# Patient Record
Sex: Male | Born: 1975 | Race: White | Hispanic: No | Marital: Single | Smoking: Current every day smoker
Health system: Southern US, Community
[De-identification: ages and names within clinical notes are randomized; demographics above are authoritative.]

## PROBLEM LIST (undated history)

## (undated) DIAGNOSIS — Z87442 Personal history of urinary calculi: Secondary | ICD-10-CM

## (undated) DIAGNOSIS — K219 Gastro-esophageal reflux disease without esophagitis: Secondary | ICD-10-CM

## (undated) DIAGNOSIS — N200 Calculus of kidney: Secondary | ICD-10-CM

## (undated) DIAGNOSIS — T8859XA Other complications of anesthesia, initial encounter: Secondary | ICD-10-CM

## (undated) HISTORY — PX: COLONOSCOPY: SHX174

## (undated) HISTORY — PX: EYE SURGERY: SHX253

## (undated) HISTORY — PX: VASECTOMY: SHX75

## (undated) HISTORY — PX: UPPER GASTROINTESTINAL ENDOSCOPY: SHX188

## (undated) HISTORY — PX: OTHER SURGICAL HISTORY: SHX169

## (undated) HISTORY — PX: HAND SURGERY: SHX662

---

## 1999-03-15 ENCOUNTER — Emergency Department (HOSPITAL_COMMUNITY): Admission: EM | Admit: 1999-03-15 | Discharge: 1999-03-15 | Payer: Self-pay

## 2004-10-23 ENCOUNTER — Emergency Department (HOSPITAL_COMMUNITY): Admission: EM | Admit: 2004-10-23 | Discharge: 2004-10-23 | Payer: Self-pay | Admitting: Emergency Medicine

## 2004-10-29 ENCOUNTER — Emergency Department (HOSPITAL_COMMUNITY): Admission: EM | Admit: 2004-10-29 | Discharge: 2004-10-30 | Payer: Self-pay | Admitting: Emergency Medicine

## 2005-07-13 ENCOUNTER — Emergency Department (HOSPITAL_COMMUNITY): Admission: EM | Admit: 2005-07-13 | Discharge: 2005-07-13 | Payer: Self-pay | Admitting: Emergency Medicine

## 2005-07-20 ENCOUNTER — Ambulatory Visit (HOSPITAL_COMMUNITY): Admission: RE | Admit: 2005-07-20 | Discharge: 2005-07-20 | Payer: Self-pay | Admitting: *Deleted

## 2005-07-20 ENCOUNTER — Ambulatory Visit (HOSPITAL_BASED_OUTPATIENT_CLINIC_OR_DEPARTMENT_OTHER): Admission: RE | Admit: 2005-07-20 | Discharge: 2005-07-20 | Payer: Self-pay | Admitting: *Deleted

## 2010-01-13 ENCOUNTER — Emergency Department: Payer: Self-pay | Admitting: Emergency Medicine

## 2010-08-07 ENCOUNTER — Emergency Department: Payer: Self-pay | Admitting: Emergency Medicine

## 2011-10-04 ENCOUNTER — Emergency Department: Payer: Self-pay | Admitting: Unknown Physician Specialty

## 2014-10-02 ENCOUNTER — Emergency Department: Payer: Self-pay | Admitting: Emergency Medicine

## 2014-10-02 LAB — URINALYSIS, COMPLETE
Bacteria: NONE SEEN
Bilirubin,UR: NEGATIVE
Glucose,UR: NEGATIVE mg/dL (ref 0–75)
Ketone: NEGATIVE
Leukocyte Esterase: NEGATIVE
Nitrite: NEGATIVE
Ph: 5 (ref 4.5–8.0)
Protein: NEGATIVE
RBC,UR: 152 /HPF (ref 0–5)
Specific Gravity: 1.021 (ref 1.003–1.030)
Squamous Epithelial: <1
WBC UR: 4 /HPF (ref 0–5)

## 2016-04-30 ENCOUNTER — Encounter: Payer: Self-pay | Admitting: Emergency Medicine

## 2016-04-30 ENCOUNTER — Emergency Department
Admission: EM | Admit: 2016-04-30 | Discharge: 2016-04-30 | Disposition: A | Discharge status: Home / Self Care | Payer: BLUE CROSS/BLUE SHIELD | Attending: Emergency Medicine | Admitting: Emergency Medicine

## 2016-04-30 ENCOUNTER — Emergency Department: Payer: BLUE CROSS/BLUE SHIELD

## 2016-04-30 DIAGNOSIS — N201 Calculus of ureter: Secondary | ICD-10-CM | POA: Diagnosis not present

## 2016-04-30 DIAGNOSIS — N200 Calculus of kidney: Secondary | ICD-10-CM | POA: Diagnosis present

## 2016-04-30 DIAGNOSIS — F17200 Nicotine dependence, unspecified, uncomplicated: Secondary | ICD-10-CM | POA: Insufficient documentation

## 2016-04-30 HISTORY — DX: Calculus of kidney: N20.0

## 2016-04-30 HISTORY — DX: Gastro-esophageal reflux disease without esophagitis: K21.9

## 2016-04-30 LAB — CBC WITH DIFFERENTIAL/PLATELET
Basophils Absolute: 0.1 10*3/uL (ref 0–0.1)
Basophils Relative: 1 %
Eosinophils Absolute: 0.4 10*3/uL (ref 0–0.7)
Eosinophils Relative: 5 %
HCT: 47 % (ref 40.0–52.0)
Hemoglobin: 15.9 g/dL (ref 13.0–18.0)
Lymphocytes Relative: 31 %
Lymphs Abs: 2.8 10*3/uL (ref 1.0–3.6)
MCH: 31.8 pg (ref 26.0–34.0)
MCHC: 33.9 g/dL (ref 32.0–36.0)
MCV: 93.8 fL (ref 80.0–100.0)
Monocytes Absolute: 0.8 10*3/uL (ref 0.2–1.0)
Monocytes Relative: 9 %
Neutro Abs: 5 10*3/uL (ref 1.4–6.5)
Neutrophils Relative %: 54 %
Platelets: 178 10*3/uL (ref 150–440)
RBC: 5.01 MIL/uL (ref 4.40–5.90)
RDW: 13 % (ref 11.5–14.5)
WBC: 9.1 10*3/uL (ref 3.8–10.6)

## 2016-04-30 LAB — URINALYSIS COMPLETE WITH MICROSCOPIC (ARMC ONLY)
Bilirubin Urine: NEGATIVE
Glucose, UA: NEGATIVE mg/dL
Hgb urine dipstick: NEGATIVE
Ketones, ur: NEGATIVE mg/dL
Leukocytes, UA: NEGATIVE
Nitrite: NEGATIVE
Protein, ur: 30 mg/dL — AB
Specific Gravity, Urine: 1.03 (ref 1.005–1.030)
pH: 6 (ref 5.0–8.0)

## 2016-04-30 LAB — COMPREHENSIVE METABOLIC PANEL
ALT: 30 U/L (ref 17–63)
AST: 24 U/L (ref 15–41)
Albumin: 4.1 g/dL (ref 3.5–5.0)
Alkaline Phosphatase: 84 U/L (ref 38–126)
Anion gap: 5 (ref 5–15)
BUN: 17 mg/dL (ref 6–20)
CO2: 29 mmol/L (ref 22–32)
Calcium: 9.5 mg/dL (ref 8.9–10.3)
Chloride: 107 mmol/L (ref 101–111)
Creatinine, Ser: 1.19 mg/dL (ref 0.61–1.24)
GFR calc Af Amer: >60 mL/min (ref >60)
GFR calc non Af Amer: >60 mL/min (ref >60)
Glucose, Bld: 108 mg/dL — ABNORMAL HIGH (ref 65–99)
Potassium: 4.6 mmol/L (ref 3.5–5.1)
Sodium: 141 mmol/L (ref 135–145)
Total Bilirubin: 0.5 mg/dL (ref 0.3–1.2)
Total Protein: 6.7 g/dL (ref 6.5–8.1)

## 2016-04-30 MED ORDER — NAPROXEN 500 MG PO TABS: 500.0000 mg | ORAL_TABLET | Freq: Two times a day (BID) | ORAL | Status: DC

## 2016-04-30 MED ORDER — KETOROLAC TROMETHAMINE 30 MG/ML IJ SOLN
30.0000 mg | Freq: Once | INTRAMUSCULAR | Status: AC
  Administered 2016-04-30: 30 mg via INTRAMUSCULAR
  Filled 2016-04-30: qty 1

## 2016-04-30 MED ORDER — TAMSULOSIN HCL 0.4 MG PO CAPS: 0.4000 mg | ORAL_CAPSULE | Freq: Every day | ORAL | Status: DC

## 2016-04-30 MED ORDER — OXYCODONE-ACETAMINOPHEN 5-325 MG PO TABS
1.0000 | ORAL_TABLET | ORAL | Status: DC | PRN
  Administered 2016-04-30: 1 via ORAL
  Filled 2016-04-30: qty 1

## 2016-04-30 MED ORDER — OXYCODONE-ACETAMINOPHEN 5-325 MG PO TABS: 1.0000 | ORAL_TABLET | Freq: Four times a day (QID) | ORAL | Status: DC | PRN

## 2016-04-30 MED ORDER — ONDANSETRON HCL 4 MG PO TABS: 4.0000 mg | ORAL_TABLET | Freq: Every day | ORAL | Status: DC | PRN

## 2016-04-30 NOTE — ED Notes (Addendum)
Patient states that he has been having right kidney pain for about a week and a half, denies urinary symptoms, patient states that he has not injured that area that he knows of. Pain got acutely worse last PM which is why patient came to the ED today.   Patient states that he has had kidney stones in the past and this is similar to the pain he had then.

## 2016-04-30 NOTE — ED Notes (Signed)
Patient to ER for c/o right sided flank pain. States he has h/o kidney stones, feels the same as his typical pain related to kidney stones.

## 2016-04-30 NOTE — ED Provider Notes (Signed)
[facility] Emergency Department Provider Note  ____________________________________________    I have reviewed the triage vital signs and the nursing notes.   HISTORY  Chief Complaint Nephrolithiasis    HPI Maurice Ramos is a 40 y.o. male who presents with complaints of right flank pain. He has a history of kidney stones reports this feels exactly like a kidney stone. He has had pain for approximately 1-1/2 weeks. He has intermittent pain which is sharp, currently it is a dull ache. He received pain medication in triage which helped significantly. No fevers or chills. No nausea or vomiting. No hematuria. No dysuria.     Past Medical History  Diagnosis Date  . Kidney stones   . GERD (gastroesophageal reflux disease)     There are no active problems to display for this patient.   History reviewed. No pertinent past surgical history.  Current Outpatient Rx  Name  Route  Sig  Dispense  Refill  . naproxen (NAPROSYN) 500 MG tablet   Oral   Take 1 tablet (500 mg total) by mouth 2 (two) times daily with a meal.   20 tablet   2   . ondansetron (ZOFRAN) 4 MG tablet   Oral   Take 1 tablet (4 mg total) by mouth daily as needed for nausea or vomiting.   20 tablet   1   . oxyCODONE-acetaminophen (ROXICET) 5-325 MG tablet   Oral   Take 1 tablet by mouth every 6 (six) hours as needed.   20 tablet   0   . tamsulosin (FLOMAX) 0.4 MG CAPS capsule   Oral   Take 1 capsule (0.4 mg total) by mouth daily.   7 capsule   0     Allergies Review of patient's allergies indicates no known allergies.  No family history on file.  Social History Social History  Substance Use Topics  . Smoking status: Current Every Day Smoker  . Smokeless tobacco: None  . Alcohol Use: No    Review of Systems  Constitutional: Negative for fever. Eyes: Negative for redness  Cardiovascular: Negative for chest pain Respiratory: Negative for Cough Gastrointestinal: As  above for flank pain Genitourinary: Negative for dysuria. Musculoskeletal: Negative for back pain. Skin: Negative for rash. Neurological: Negative for headache Psychiatric: no anxiety    ____________________________________________   PHYSICAL EXAM:  VITAL SIGNS: ED Triage Vitals  Enc Vitals Group     BP 04/30/16 1314 133/79 mmHg     Pulse Rate 04/30/16 1314 78     Resp 04/30/16 1314 18     Temp 04/30/16 1314 97.7 F (36.5 C)     Temp Source 04/30/16 1314 Oral     SpO2 04/30/16 1314 99 %     Weight 04/30/16 1314 220 lb (99.791 kg)     Height 04/30/16 1314  (1.753 m)     Head Cir --      Peak Flow --      Pain Score 04/30/16 1314 6     Pain Loc --      Pain Edu? --      Excl. in GC? --      Constitutional: Alert and oriented. Well appearing and in no distress.  Eyes: Conjunctivae are normal. No erythema or injection ENT   Head: Normocephalic and atraumatic.   Mouth/Throat: Mucous membranes are moist. Cardiovascular: Normal rate, regular rhythm.  Respiratory: Normal respiratory effort without tachypnea nor retractions.  Gastrointestinal: Soft and non-tender in all quadrants. No distention. There  is no CVA tenderness. Genitourinary: deferred Musculoskeletal: Nontender with normal range of motion in all extremities. No lower extremity tenderness nor edema. Neurologic:  Normal speech and language. No gross focal neurologic deficits are appreciated. Skin:  Skin is warm, dry and intact. No rash noted. Psychiatric: Mood and affect are normal. Patient exhibits appropriate insight and judgment.  ____________________________________________    LABS (pertinent positives/negatives)  Labs Reviewed  URINALYSIS COMPLETEWITH MICROSCOPIC ([facility] ONLY) - Abnormal; Notable for the following:    Color, Urine YELLOW (*)    APPearance CLEAR (*)    Protein, ur 30 (*)    Bacteria, UA RARE (*)    Squamous Epithelial / LPF 0-5 (*)    All other components within normal  limits  COMPREHENSIVE METABOLIC PANEL - Abnormal; Notable for the following:    Glucose, Bld 108 (*)    All other components within normal limits  CBC WITH DIFFERENTIAL/PLATELET    ____________________________________________   EKG  None  ____________________________________________    RADIOLOGY  KUB shows questionable proximal right ureteral stone  ____________________________________________   PROCEDURES  Procedure(s) performed: none  Critical Care performed: none  ____________________________________________   INITIAL IMPRESSION / ASSESSMENT AND PLAN / ED COURSE  Pertinent labs & imaging results that were available during my care of the patient were reviewed by me and considered in my medical decision making (see chart for details).  Patient treated with Percocet in triage and Toradol IM. He has had complete resolution of pain. No evidence of infection on urinalysis, lab work is normal. Afebrile, no tachycardia. Comfortable and nontoxic. KUB shows likely proximal ureteral stone. Offered CT renal stone study the patient reports he is feeling well and would prefer to go home with pain medication and urology follow-up. He knows to return if fever or chills worsening pain ____________________________________________   FINAL CLINICAL IMPRESSION(S) / ED DIAGNOSES  Final diagnoses:  Ureterolithiasis          Jene Everyobert Dealva Lafoy, MD 04/30/16 [phone]

## 2016-04-30 NOTE — Discharge Instructions (Signed)
Kidney Stones °Kidney stones (urolithiasis) are deposits that form inside your kidneys. The intense pain is caused by the stone moving through the urinary tract. When the stone moves, the ureter goes into spasm around the stone. The stone is usually passed in the urine.  °CAUSES  °· A disorder that makes certain neck glands produce too much parathyroid hormone (primary hyperparathyroidism). °· A buildup of uric acid crystals, similar to gout in your joints. °· Narrowing (stricture) of the ureter. °· A kidney obstruction present at birth (congenital obstruction). °· Previous surgery on the kidney or ureters. °· Numerous kidney infections. °SYMPTOMS  °· Feeling sick to your stomach (nauseous). °· Throwing up (vomiting). °· Blood in the urine (hematuria). °· Pain that usually spreads (radiates) to the groin. °· Frequency or urgency of urination. °DIAGNOSIS  °· Taking a history and physical exam. °· Blood or urine tests. °· CT scan. °· Occasionally, an examination of the inside of the urinary bladder (cystoscopy) is performed. °TREATMENT  °· Observation. °· Increasing your fluid intake. °· Extracorporeal shock wave lithotripsy--This is a noninvasive procedure that uses shock waves to break up kidney stones. °· Surgery may be needed if you have severe pain or persistent obstruction. There are various surgical procedures. Most of the procedures are performed with the use of small instruments. Only small incisions are needed to accommodate these instruments, so recovery time is minimized. °The size, location, and chemical composition are all important variables that will determine the proper choice of action for you. Talk to your health care provider to better understand your situation so that you will minimize the risk of injury to yourself and your kidney.  °HOME CARE INSTRUCTIONS  °· Drink enough water and fluids to keep your urine clear or pale yellow. This will help you to pass the stone or stone fragments. °· Strain  all urine through the provided strainer. Keep all particulate matter and stones for your health care provider to see. The stone causing the pain may be as small as a grain of salt. It is very important to use the strainer each and every time you pass your urine. The collection of your stone will allow your health care provider to analyze it and verify that a stone has actually passed. The stone analysis will often identify what you can do to reduce the incidence of recurrences. °· Only take over-the-counter or prescription medicines for pain, discomfort, or fever as directed by your health care provider. °· Keep all follow-up visits as told by your health care provider. This is important. °· Get follow-up X-rays if required. The absence of pain does not always mean that the stone has passed. It may have only stopped moving. If the urine remains completely obstructed, it can cause loss of kidney function or even complete destruction of the kidney. It is your responsibility to make sure X-rays and follow-ups are completed. Ultrasounds of the kidney can show blockages and the status of the kidney. Ultrasounds are not associated with any radiation and can be performed easily in a matter of minutes. °· Make changes to your daily diet as told by your health care provider. You may be told to: °¨ Limit the amount of salt that you eat. °¨ Eat 5 or more servings of fruits and vegetables each day. °¨ Limit the amount of meat, poultry, fish, and eggs that you eat. °· Collect a 24-hour urine sample as told by your health care provider. You may need to collect another urine sample every 6-12   months. °SEEK MEDICAL CARE IF: °· You experience pain that is progressive and unresponsive to any pain medicine you have been prescribed. °SEEK IMMEDIATE MEDICAL CARE IF:  °· Pain cannot be controlled with the prescribed medicine. °· You have a fever or shaking chills. °· The severity or intensity of pain increases over 18 hours and is not  relieved by pain medicine. °· You develop a new onset of abdominal pain. °· You feel faint or pass out. °· You are unable to urinate. °  °This information is not intended to replace advice given to you by your health care provider. Make sure you discuss any questions you have with your health care provider. °  °Document Released: 12/05/2005 Document Revised: 08/26/2015 Document Reviewed: 05/08/2013 °Elsevier Interactive Patient Education ©2016 [employer] ° °

## 2016-04-30 NOTE — ED Notes (Addendum)
Pt observed laughing in lobby with family/friends and pt ambulatory to stat desk w/ no distress; pt explained when blood drawn he still had to wait to be seen by dr for results and there is still a wait time and we were doing the best we can to see everyone. Pt seemed understanding of answer and sent back out to lobby

## 2016-10-18 ENCOUNTER — Emergency Department
Admission: EM | Admit: 2016-10-18 | Discharge: 2016-10-18 | Disposition: A | Discharge status: Home / Self Care | Payer: BLUE CROSS/BLUE SHIELD | Attending: Student in an Organized Health Care Education/Training Program | Admitting: Student in an Organized Health Care Education/Training Program

## 2016-10-18 ENCOUNTER — Emergency Department: Payer: BLUE CROSS/BLUE SHIELD

## 2016-10-18 ENCOUNTER — Encounter: Payer: Self-pay | Admitting: Emergency Medicine

## 2016-10-18 DIAGNOSIS — N201 Calculus of ureter: Secondary | ICD-10-CM | POA: Insufficient documentation

## 2016-10-18 DIAGNOSIS — F17200 Nicotine dependence, unspecified, uncomplicated: Secondary | ICD-10-CM | POA: Insufficient documentation

## 2016-10-18 DIAGNOSIS — R109 Unspecified abdominal pain: Secondary | ICD-10-CM

## 2016-10-18 DIAGNOSIS — Z791 Long term (current) use of non-steroidal anti-inflammatories (NSAID): Secondary | ICD-10-CM | POA: Insufficient documentation

## 2016-10-18 LAB — URINALYSIS COMPLETE WITH MICROSCOPIC (ARMC ONLY)
Bacteria, UA: NONE SEEN
Bilirubin Urine: NEGATIVE
Glucose, UA: NEGATIVE mg/dL
Ketones, ur: NEGATIVE mg/dL
Leukocytes, UA: NEGATIVE
Nitrite: NEGATIVE
Protein, ur: 30 mg/dL — AB
Specific Gravity, Urine: 1.021 (ref 1.005–1.030)
Squamous Epithelial / HPF: NONE SEEN
pH: 7 (ref 5.0–8.0)

## 2016-10-18 LAB — COMPREHENSIVE METABOLIC PANEL WITH GFR
ALT: 22 U/L (ref 17–63)
AST: 27 U/L (ref 15–41)
Albumin: 4.2 g/dL (ref 3.5–5.0)
Alkaline Phosphatase: 87 U/L (ref 38–126)
Anion gap: 5 (ref 5–15)
BUN: 16 mg/dL (ref 6–20)
CO2: 30 mmol/L (ref 22–32)
Calcium: 9.4 mg/dL (ref 8.9–10.3)
Chloride: 106 mmol/L (ref 101–111)
Creatinine, Ser: 1.49 mg/dL — ABNORMAL HIGH (ref 0.61–1.24)
GFR calc Af Amer: >60 mL/min
GFR calc non Af Amer: 58 mL/min — ABNORMAL LOW
Glucose, Bld: 106 mg/dL — ABNORMAL HIGH (ref 65–99)
Potassium: 4.8 mmol/L (ref 3.5–5.1)
Sodium: 141 mmol/L (ref 135–145)
Total Bilirubin: 0.3 mg/dL (ref 0.3–1.2)
Total Protein: 7.1 g/dL (ref 6.5–8.1)

## 2016-10-18 LAB — CBC WITH DIFFERENTIAL/PLATELET
Basophils Absolute: 0.2 10*3/uL — ABNORMAL HIGH (ref 0–0.1)
Basophils Relative: 1 %
Eosinophils Absolute: 0.5 10*3/uL (ref 0–0.7)
Eosinophils Relative: 4 %
HCT: 46.6 % (ref 40.0–52.0)
Hemoglobin: 15.6 g/dL (ref 13.0–18.0)
Lymphocytes Relative: 39 %
Lymphs Abs: 4.6 10*3/uL — ABNORMAL HIGH (ref 1.0–3.6)
MCH: 31.9 pg (ref 26.0–34.0)
MCHC: 33.5 g/dL (ref 32.0–36.0)
MCV: 95.2 fL (ref 80.0–100.0)
Monocytes Absolute: 0.9 10*3/uL (ref 0.2–1.0)
Monocytes Relative: 8 %
Neutro Abs: 5.7 10*3/uL (ref 1.4–6.5)
Neutrophils Relative %: 48 %
Platelets: 192 10*3/uL (ref 150–440)
RBC: 4.89 MIL/uL (ref 4.40–5.90)
RDW: 13.1 % (ref 11.5–14.5)
WBC: 11.8 10*3/uL — ABNORMAL HIGH (ref 3.8–10.6)

## 2016-10-18 MED ORDER — MORPHINE SULFATE (PF) 2 MG/ML IV SOLN
4.0000 mg | INTRAVENOUS | Status: DC | PRN
  Administered 2016-10-18: 4 mg via INTRAVENOUS
  Filled 2016-10-18: qty 2

## 2016-10-18 MED ORDER — TAMSULOSIN HCL 0.4 MG PO CAPS
0.4000 mg | ORAL_CAPSULE | Freq: Once | ORAL | Status: AC
  Administered 2016-10-18: 0.4 mg via ORAL
  Filled 2016-10-18: qty 1

## 2016-10-18 MED ORDER — PROMETHAZINE HCL 12.5 MG PO TABS: 12.5000 mg | ORAL_TABLET | Freq: Four times a day (QID) | ORAL | 0 refills | Status: DC | PRN

## 2016-10-18 MED ORDER — HYDROMORPHONE HCL 1 MG/ML IJ SOLN
1.0000 mg | INTRAMUSCULAR | Status: DC | PRN
  Administered 2016-10-18: 1 mg via INTRAVENOUS
  Filled 2016-10-18: qty 1

## 2016-10-18 MED ORDER — SODIUM CHLORIDE 0.9 % IV BOLUS (SEPSIS)
1000.0000 mL | Freq: Once | INTRAVENOUS | Status: AC
  Administered 2016-10-18: 1000 mL via INTRAVENOUS

## 2016-10-18 MED ORDER — OXYCODONE-ACETAMINOPHEN 5-325 MG PO TABS: 1.0000 | ORAL_TABLET | Freq: Four times a day (QID) | ORAL | 0 refills | Status: AC | PRN

## 2016-10-18 MED ORDER — KETOROLAC TROMETHAMINE 30 MG/ML IJ SOLN
30.0000 mg | Freq: Once | INTRAMUSCULAR | Status: AC
  Administered 2016-10-18: 30 mg via INTRAVENOUS
  Filled 2016-10-18: qty 1

## 2016-10-18 MED ORDER — HYDROCODONE-ACETAMINOPHEN 5-325 MG PO TABS
1.0000 | ORAL_TABLET | Freq: Once | ORAL | Status: AC
  Administered 2016-10-18: 1 via ORAL
  Filled 2016-10-18: qty 1

## 2016-10-18 MED ORDER — TAMSULOSIN HCL 0.4 MG PO CAPS: 0.4000 mg | ORAL_CAPSULE | Freq: Every day | ORAL | 0 refills | Status: DC

## 2016-10-18 MED ORDER — PROMETHAZINE HCL 25 MG/ML IJ SOLN
12.5000 mg | Freq: Once | INTRAMUSCULAR | Status: AC
  Administered 2016-10-18: 12.5 mg via INTRAVENOUS
  Filled 2016-10-18: qty 1

## 2016-10-18 NOTE — Discharge Instructions (Signed)
Return for fevers, intractable, pain, inability to tolerate food or hydration by mouth.

## 2016-10-18 NOTE — ED Triage Notes (Signed)
Pt ambulatory to room 7, appears uncomfortable; st awoken PTA with right flank pain radiating into lower abd accomp by N/V; st hx kidney stones

## 2016-10-18 NOTE — ED Provider Notes (Signed)
[facility] Emergency Department Provider Note    First MD Initiated Contact with Patient 10/18/16 0020     (approximate)  I have reviewed the triage vital signs and the nursing notes.   HISTORY  Chief Complaint No chief complaint on file.    HPI Maurice Ramos is a 40 y.o. male with a history of reflux and kidney stones presents with acute onset 10/10 right flank pain with radiation down into his groin and right testicle associated with nausea and vomiting. Patient states the pain is waxing and waning and feels identical to his previous kidney stones. States that he has not had one this severe in several years however. Denies any fevers or dysuria. Has not ever had have any stones surgically removed. Does have a positive family history of kidney stones. No history of hypertension.   Past Medical History:  Diagnosis Date  . GERD (gastroesophageal reflux disease)   . Kidney stones     There are no active problems to display for this patient.   No past surgical history on file.  Prior to Admission medications   Medication Sig Start Date End Date Taking? Authorizing Provider  naproxen (NAPROSYN) 500 MG tablet Take 1 tablet (500 mg total) by mouth 2 (two) times daily with a meal. 04/30/16   Jene Everyobert Kinner, MD  ondansetron (ZOFRAN) 4 MG tablet Take 1 tablet (4 mg total) by mouth daily as needed for nausea or vomiting. 04/30/16   Jene Everyobert Kinner, MD  oxyCODONE-acetaminophen (ROXICET) 5-325 MG tablet Take 1 tablet by mouth every 6 (six) hours as needed. 04/30/16 04/30/17  Jene Everyobert Kinner, MD  tamsulosin (FLOMAX) 0.4 MG CAPS capsule Take 1 capsule (0.4 mg total) by mouth daily. 04/30/16   Jene Everyobert Kinner, MD    Allergies Review of patient's allergies indicates no known allergies.  No family history on file.  Social History Social History  Substance Use Topics  . Smoking status: Current Every Day Smoker  . Smokeless tobacco: Not on file  . Alcohol use No     Review of Systems Patient denies headaches, rhinorrhea, blurry vision, numbness, shortness of breath, chest pain, edema, cough, abdominal pain, nausea, vomiting, diarrhea, dysuria, fevers, rashes or hallucinations unless otherwise stated above in HPI. ____________________________________________   PHYSICAL EXAM:  VITAL SIGNS: Vitals:   10/18/16 0024  BP: (!) 125/100  Pulse: 88  Resp: 20  Temp: 98 F (36.7 C)    Constitutional: Alert and oriented. Ill appearing and uncomfortable Eyes: Conjunctivae are normal. PERRL. EOMI. Head: Atraumatic. Nose: No congestion/rhinnorhea. Mouth/Throat: Mucous membranes are moist.  Oropharynx non-erythematous. Neck: No stridor. Painless ROM. No cervical spine tenderness to palpation Hematological/Lymphatic/Immunilogical: No cervical lymphadenopathy. Cardiovascular: Normal rate, regular rhythm. Grossly normal heart sounds.  Good peripheral circulation. Respiratory: Normal respiratory effort.  No retractions. Lungs CTAB. Gastrointestinal: Soft and nontender. No distention. No abdominal bruits. Right CVA tenderness. Genitourinary:  Musculoskeletal: No lower extremity tenderness nor edema.  No joint effusions. Neurologic:  Normal speech and language. No gross focal neurologic deficits are appreciated. No gait instability. Skin:  Skin is warm, dry and intact. No rash noted. Psychiatric: Mood and affect are normal. Speech and behavior are normal.  ____________________________________________   LABS (all labs ordered are listed, but only abnormal results are displayed)  No results found for this or any previous visit (from the past 24 hour(s)). ____________________________________________   ____________________________________________  RADIOLOGY  I personally reviewed all radiographic images ordered to evaluate for the above acute complaints and reviewed radiology reports  and findings.  These findings were personally discussed with the  patient.  Please see medical record for radiology report.  ____________________________________________   PROCEDURES  Procedure(s) performed: none    Critical Care performed: no ____________________________________________   INITIAL IMPRESSION / ASSESSMENT AND PLAN / ED COURSE  Pertinent labs & imaging results that were available during my care of the patient were reviewed by me and considered in my medical decision making (see chart for details).  DDX: Stone, Pilo, muscular skeletal pain, AAA, colitis, diverticulitis  Maurice FetchRodney Dolney is a 40 y.o. who presents to the ED with with Hx of kidney stones p/w right flank pain. No fevers, no systemic symptoms. - urinary symptoms. Denies trauma or injury. Afebrile in ED. Exam as above. Flank TTP, otherwise abdominal exam is benign. No peritoneal signs. Possible kidney stone, cystitis, or pyelonephritis.  Clinical picture is not consistent with appendicitis, diverticulitis, pancreatitis, cholecystitis, bowel perforation, aortic dissection, splenic injury or acute abdominal process at this time.    Clinical Course  Comment By Time  Patient reassessed. Willy EddyPatrick Teressa Mcglocklin, MD 10/31 0222  Pain has improved. Discussed findings on CT imaging shows evidence of small 3 mm right-sided stone which should explain the patient's presentation. Willy EddyPatrick Masen Salvas, MD 10/31 [phone]  Urinalysis with no evidence of bacteria or infection. We'll try to transition patient on oral medications. Willy EddyPatrick Marsha Hillman, MD 10/31 0230  Patient reassessed. Pain now down to a 3 out of 10. Patient is tolerating oral hydration. He is stable vital signs. Based on his high probability of spontaneously passing the stone without any signs of infection and improvement in symptoms or do feel the patient is appropriate for outpatient follow-up with urology.  Patient was able to tolerate PO and was able to ambulate with a steady gait.  Have discussed with the patient and available family all  diagnostics and treatments performed thus far and all questions were answered to the best of my ability. The patient demonstrates understanding and agreement with plan.  Willy EddyPatrick Hulet Ehrmann, MD 10/31 [phone]     ____________________________________________   FINAL CLINICAL IMPRESSION(S) / ED DIAGNOSES  Final diagnoses:  Ureterolithiasis  Right flank pain      NEW MEDICATIONS STARTED DURING THIS VISIT:  New Prescriptions   No medications on file     Note:  This document was prepared using Dragon voice recognition software and may include unintentional dictation errors.    Willy EddyPatrick Georgena Weisheit, MD 10/18/16 [phone]

## 2018-04-17 ENCOUNTER — Encounter: Payer: Self-pay | Admitting: Emergency Medicine

## 2018-04-17 ENCOUNTER — Emergency Department: Payer: Self-pay

## 2018-04-17 ENCOUNTER — Emergency Department
Admission: EM | Admit: 2018-04-17 | Discharge: 2018-04-17 | Disposition: A | Discharge status: Home / Self Care | Payer: Self-pay | Attending: Emergency Medicine | Admitting: Emergency Medicine

## 2018-04-17 ENCOUNTER — Other Ambulatory Visit: Payer: Self-pay

## 2018-04-17 DIAGNOSIS — M5412 Radiculopathy, cervical region: Secondary | ICD-10-CM | POA: Insufficient documentation

## 2018-04-17 DIAGNOSIS — R200 Anesthesia of skin: Secondary | ICD-10-CM | POA: Insufficient documentation

## 2018-04-17 DIAGNOSIS — F17200 Nicotine dependence, unspecified, uncomplicated: Secondary | ICD-10-CM | POA: Insufficient documentation

## 2018-04-17 DIAGNOSIS — Z79899 Other long term (current) drug therapy: Secondary | ICD-10-CM | POA: Insufficient documentation

## 2018-04-17 DIAGNOSIS — R202 Paresthesia of skin: Secondary | ICD-10-CM | POA: Insufficient documentation

## 2018-04-17 MED ORDER — ORPHENADRINE CITRATE ER 100 MG PO TB12: 100.0000 mg | ORAL_TABLET | Freq: Two times a day (BID) | ORAL | 0 refills | Status: AC

## 2018-04-17 MED ORDER — METHYLPREDNISOLONE SODIUM SUCC 125 MG IJ SOLR
125.0000 mg | Freq: Once | INTRAMUSCULAR | Status: AC
  Administered 2018-04-17: 125 mg via INTRAMUSCULAR
  Filled 2018-04-17: qty 2

## 2018-04-17 MED ORDER — ORPHENADRINE CITRATE 30 MG/ML IJ SOLN
60.0000 mg | Freq: Two times a day (BID) | INTRAMUSCULAR | Status: DC
  Administered 2018-04-17: 60 mg via INTRAMUSCULAR
  Filled 2018-04-17: qty 2

## 2018-04-17 MED ORDER — PREDNISONE 50 MG PO TABS: ORAL_TABLET | ORAL | 0 refills | Status: DC

## 2018-04-17 NOTE — ED Provider Notes (Signed)
[facility] Emergency Department Provider Note  ____________________________________________  Time seen: Approximately 11:32 PM  I have reviewed the triage vital signs and the nursing notes.   HISTORY  Chief Complaint Torticollis    HPI Maurice Ramos is a 42 y.o. male presents to the emergency department with referred pain from the neck.  Patient reports a sensation of numbness and tingling that radiates down the upper extremities into the hands when patient moves his neck.  Patient reports that he has experienced these symptoms for approximately 1 week.  He has tried anti-inflammatories at home but he reports that he has not achieved adequate pain relief.  Patient has a very physically demanding job and does a lot of pushing and pulling at work.  He denies weakness or changes in sensation in the upper extremities.  He denies falls or other traumas.  Patient denies chest pain and chest tightness.   Past Medical History:  Diagnosis Date  . GERD (gastroesophageal reflux disease)   . Kidney stones     There are no active problems to display for this patient.   History reviewed. No pertinent surgical history.  Prior to Admission medications   Medication Sig Start Date End Date Taking? Authorizing Provider  naproxen (NAPROSYN) 500 MG tablet Take 1 tablet (500 mg total) by mouth 2 (two) times daily with a meal. 04/30/16   Jene Every, MD  ondansetron (ZOFRAN) 4 MG tablet Take 1 tablet (4 mg total) by mouth daily as needed for nausea or vomiting. 04/30/16   Jene Every, MD  orphenadrine (NORFLEX) 100 MG tablet Take 1 tablet (100 mg total) by mouth 2 (two) times daily for 5 days. 04/17/18 04/22/18  Orvil Feil, PA-C  predniSONE (DELTASONE) 50 MG tablet Take one 50 mg tablet once daily for the next 5 days. 04/17/18   Orvil Feil, PA-C  promethazine (PHENERGAN) 12.5 MG tablet Take 1 tablet (12.5 mg total) by mouth every 6 (six) hours as needed for nausea or  vomiting. 10/18/16   Willy Eddy, MD  tamsulosin (FLOMAX) 0.4 MG CAPS capsule Take 1 capsule (0.4 mg total) by mouth daily. 10/18/16   Willy Eddy, MD    Allergies Patient has no known allergies.  No family history on file.  Social History Social History   Tobacco Use  . Smoking status: Current Every Day Smoker  . Smokeless tobacco: Never Used  Substance Use Topics  . Alcohol use: No  . Drug use: Not on file     Review of Systems  Constitutional: No fever/chills Eyes: No visual changes. No discharge ENT: No upper respiratory complaints. Cardiovascular: no chest pain. Respiratory: no cough. No SOB. Gastrointestinal: No abdominal pain.  No nausea, no vomiting.  No diarrhea.  No constipation. Musculoskeletal: Patient has referred pain from the neck. Skin: Negative for rash, abrasions, lacerations, ecchymosis. Neurological: Negative for headaches, focal weakness or numbness.   ____________________________________________   PHYSICAL EXAM:  VITAL SIGNS: ED Triage Vitals  Enc Vitals Group     BP 04/17/18 1953 (!) 148/95     Pulse Rate 04/17/18 1953 83     Resp 04/17/18 1953 18     Temp 04/17/18 1953 98 F (36.7 C)     Temp Source 04/17/18 1953 Oral     SpO2 04/17/18 1953 97 %     Weight 04/17/18 1954 240 lb (108.9 kg)     Height 04/17/18 1954  (1.753 m)     Head Circumference --  Peak Flow --      Pain Score 04/17/18 1954 0     Pain Loc --      Pain Edu? --      Excl. in GC? --      Constitutional: Alert and oriented. Well appearing and in no acute distress. Eyes: Conjunctivae are normal. PERRL. EOMI. Head: Atraumatic. ENT:      Ears: TMs are pearly.      Nose: No congestion/rhinnorhea.      Mouth/Throat: Mucous membranes are moist.  Neck: No stridor.  No cervical spine tenderness to palpation.  Range of motion at the neck elicits radiculopathy. Cardiovascular: Normal rate, regular rhythm. Normal S1 and S2.  Good peripheral  circulation. Respiratory: Normal respiratory effort without tachypnea or retractions. Lungs CTAB. Good air entry to the bases with no decreased or absent breath sounds. Musculoskeletal: Full range of motion to all extremities. No gross deformities appreciated. Neurologic:  Normal speech and language. No gross focal neurologic deficits are appreciated.  Skin:  Skin is warm, dry and intact. No rash noted.  ____________________________________________   LABS (all labs ordered are listed, but only abnormal results are displayed)  Labs Reviewed - No data to display ____________________________________________  EKG   ____________________________________________  RADIOLOGY Geraldo Pitter, personally viewed and evaluated these images (plain radiographs) as part of my medical decision making, as well as reviewing the written report by the radiologist.  Ct Cervical Spine Wo Contrast  Result Date: 04/17/2018 CLINICAL DATA:  Right-sided neck pain radiating to the arms. Manipulation of the day made the pain worse and has persisted. No known injury. EXAM: CT CERVICAL SPINE WITHOUT CONTRAST TECHNIQUE: Multidetector CT imaging of the cervical spine was performed without intravenous contrast. Multiplanar CT image reconstructions were also generated. COMPARISON:  10/23/2004 FINDINGS: Alignment: Normal alignment of the cervical vertebrae and facet joints. C1-2 articulation appears intact. Skull base and vertebrae: Skull base appears intact. No vertebral compression deformities. No focal bone lesion or bone destruction. Bone cortex appears intact. Soft tissues and spinal canal: No prevertebral soft tissue swelling. No paraspinal soft tissue mass or infiltration. Disc levels: Intervertebral disc space heights are preserved. Hypertrophic degenerative changes in the facet joints. No definite bony encroachment upon the central canal. If disc disease is suspected, MRI would be more sensitive for evaluation. Upper  chest: Minimal emphysematous changes in the lung apices. Other: None. IMPRESSION: Normal alignment of the cervical spine. No acute displaced fractures identified. Mild degenerative changes, mostly in the facet joints. Electronically Signed   By: Burman Nieves M.D.   On: 04/17/2018 21:11    ____________________________________________    PROCEDURES  Procedure(s) performed:    Procedures    Medications  orphenadrine (NORFLEX) injection 60 mg (60 mg Intramuscular Given 04/17/18 2316)  methylPREDNISolone sodium succinate (SOLU-MEDROL) 125 mg/2 mL injection 125 mg (125 mg Intramuscular Given 04/17/18 2239)     ____________________________________________   INITIAL IMPRESSION / ASSESSMENT AND PLAN / ED COURSE  Pertinent labs & imaging results that were available during my care of the patient were reviewed by me and considered in my medical decision making (see chart for details).  Review of the [place] CSRS was performed in accordance of the [facility] prior to dispensing any controlled drugs.     Assessment and plan Cervical radiculopathy Patient presents to the emergency department with referred pain from the neck.  Differential diagnosis included nerve impingement, compression fracture and unspecified cervical radiculopathy.  Patient was treated empirically with Solu-Medrol and Norflex in the  emergency department.  He was discharged with prednisone and Norflex.  A work note was provided.  Vital signs are reassuring aside from hypertension.    ____________________________________________  FINAL CLINICAL IMPRESSION(S) / ED DIAGNOSES  Final diagnoses:  Cervical radiculopathy      NEW MEDICATIONS STARTED DURING THIS VISIT:  ED Discharge Orders        Ordered    predniSONE (DELTASONE) 50 MG tablet     04/17/18 2315    orphenadrine (NORFLEX) 100 MG tablet  2 times daily     04/17/18 2315          This chart was dictated using voice recognition software/Dragon. Despite  best efforts to proofread, errors can occur which can change the meaning. Any change was purely unintentional.    Orvil Feil, PA-C 04/17/18 2337    Rockne Menghini, MD 04/17/18 [phone]

## 2018-04-17 NOTE — ED Triage Notes (Addendum)
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

## 2020-01-02 IMAGING — CT CT CERVICAL SPINE W/O CM
3 of 4 series · 10 of 33 positions shown, 12 images · non-contrast
Comparison: 10/23/2004

CLINICAL DATA: Right-sided neck pain radiating to the arms.
Manipulation of the day made the pain worse and has persisted. No
known injury.

EXAM:
CT CERVICAL SPINE WITHOUT CONTRAST
TECHNIQUE: Multidetector CT imaging of the cervical spine was performed without
intravenous contrast. Multiplanar CT image reconstructions were also
generated.

[Series 4: sagittal bone · sagittal · 0.25mm/px · 5 of 53 slices shown, 6 images]
[im 18/53  bone]
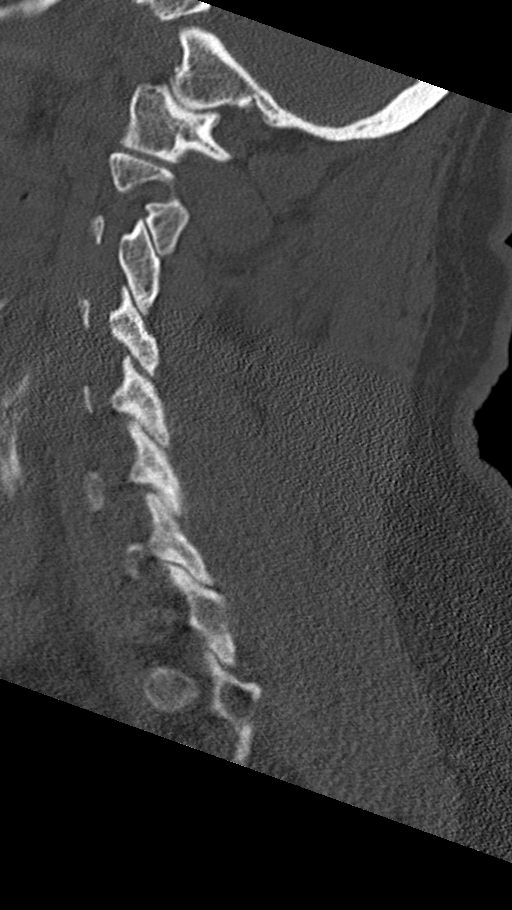
[im 22/53  bone]
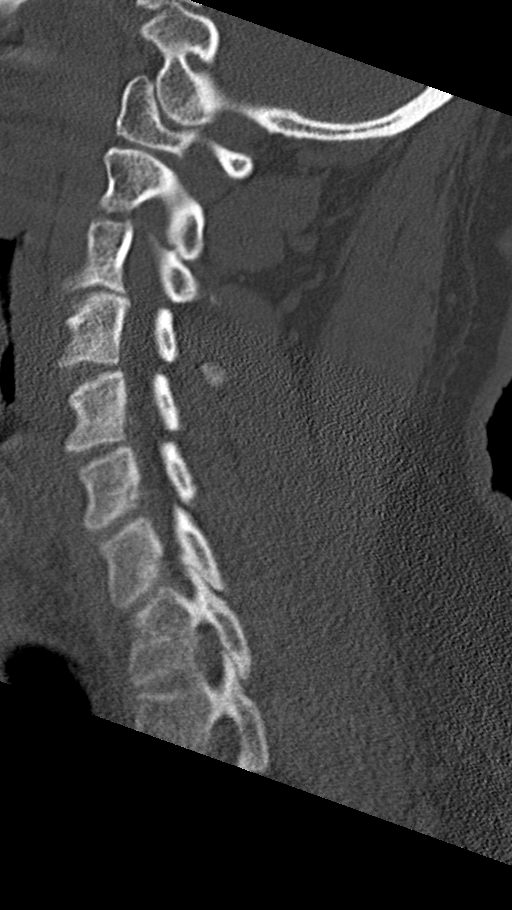
[im 27/53  soft-tissue]
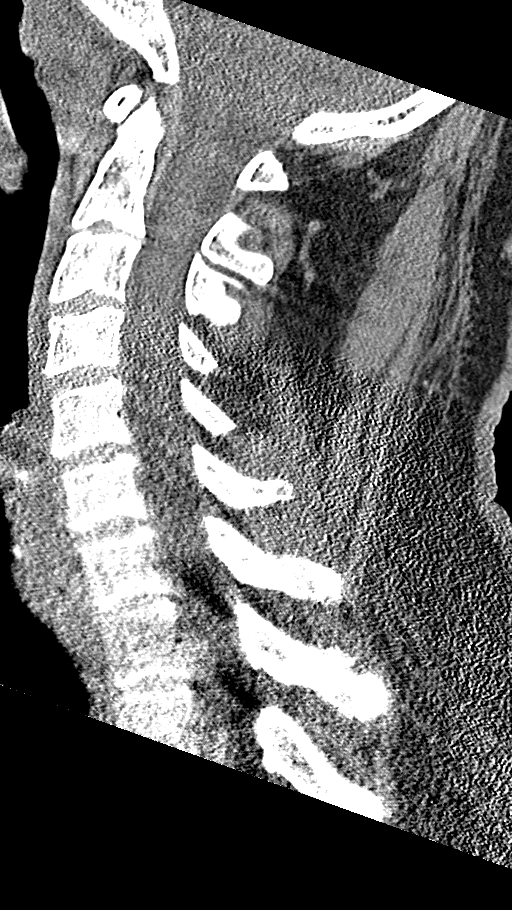
[im 27/53  bone]
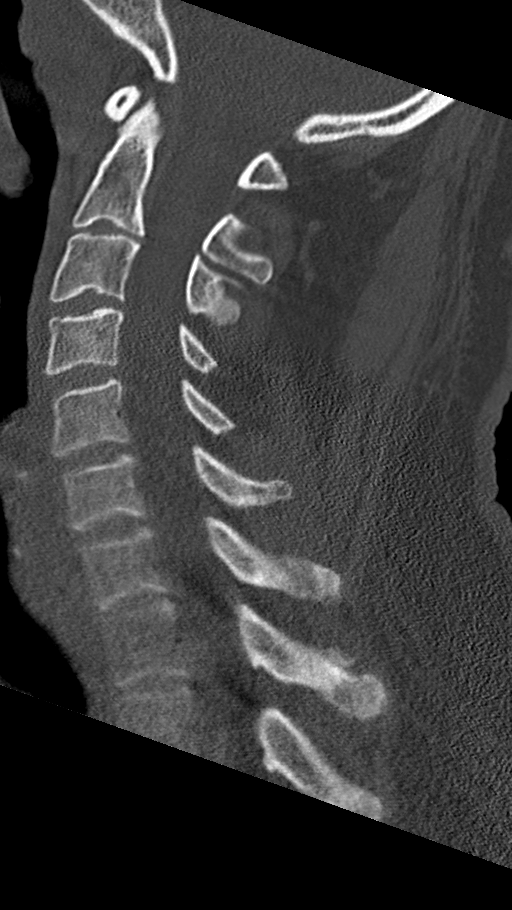
[im 31/53  bone]
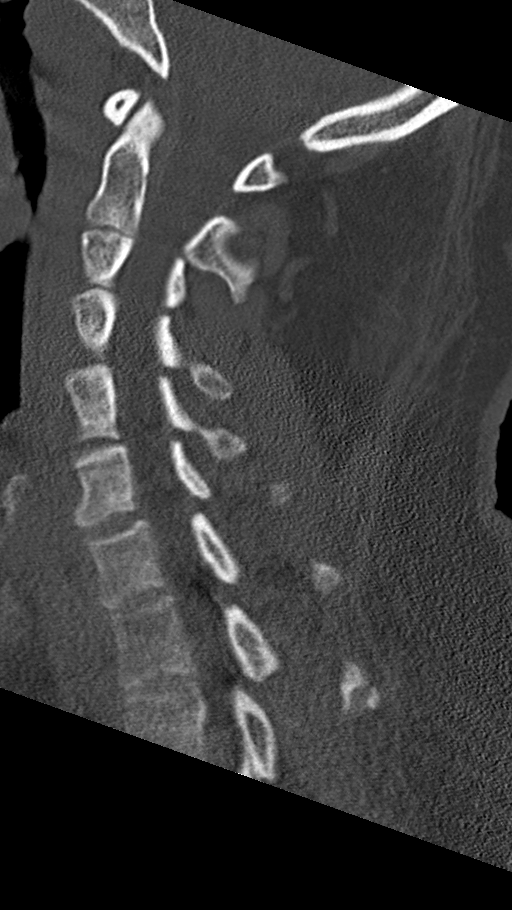
[im 35/53  bone]
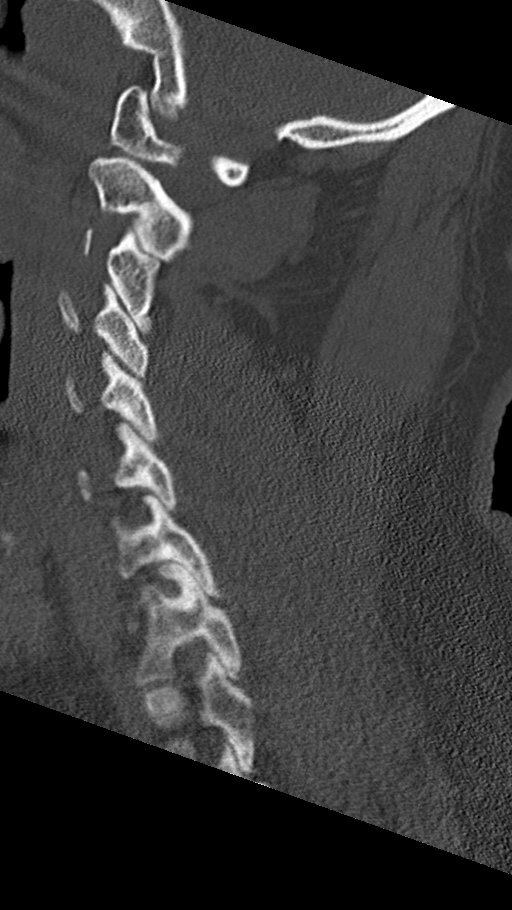

[Series 5: coronal bone · coronal · 0.24mm/px · 3 of 40 slices shown]
[im 8/40  bone]
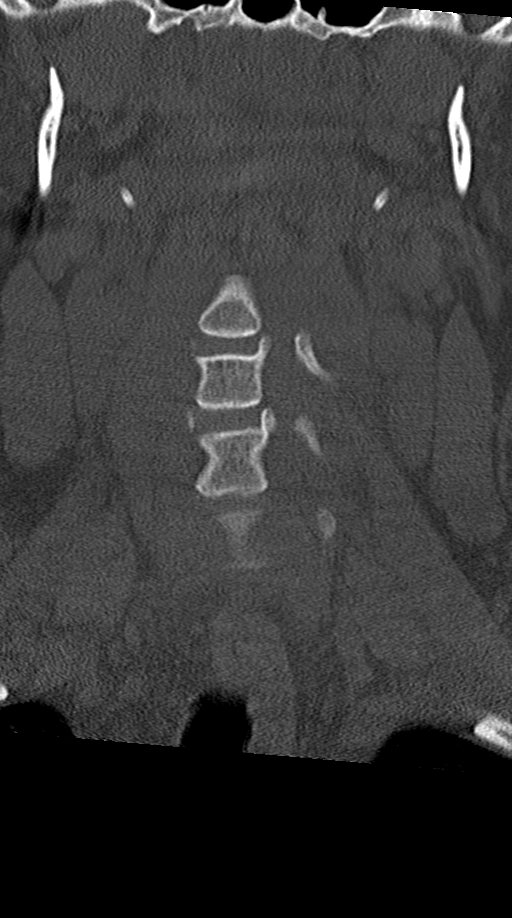
[im 16/40  bone]
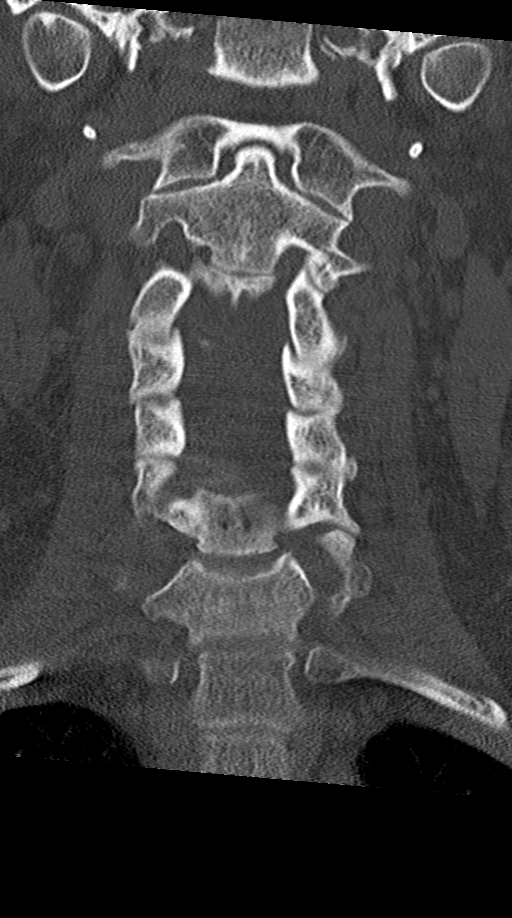
[im 24/40  bone]
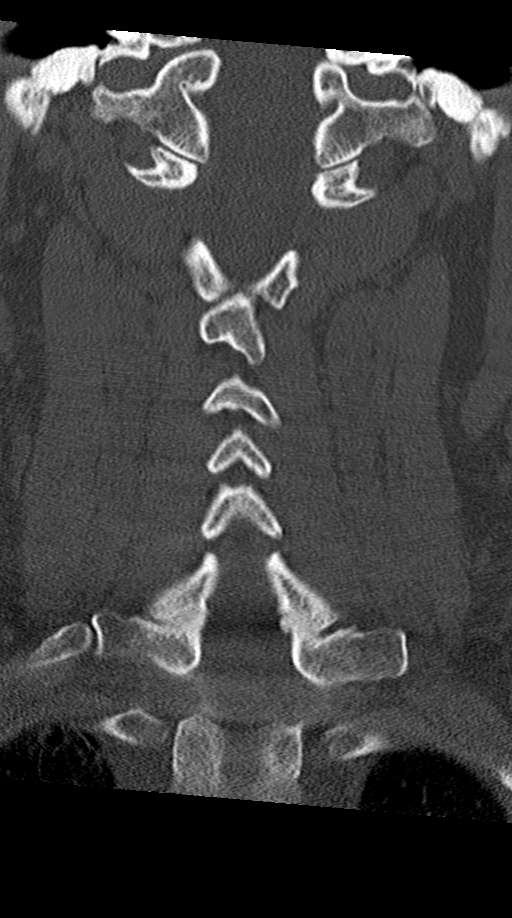

[Series 6: orthogonal bone · axial · 0.25mm/px · z∈[-219,-170]mm · 2 of 80 slices shown, 3 images]
[im 27/80  soft-tissue]
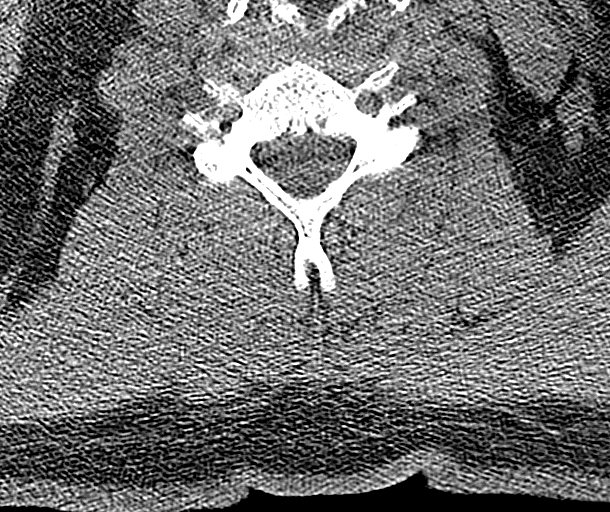
[im 27/80  bone]
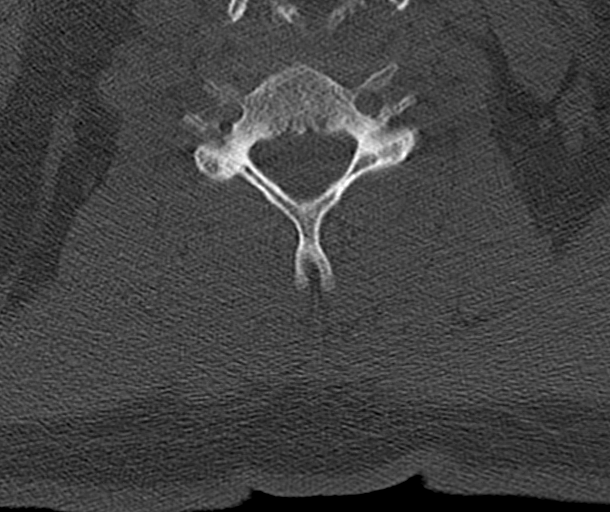
[im 53/80  bone]
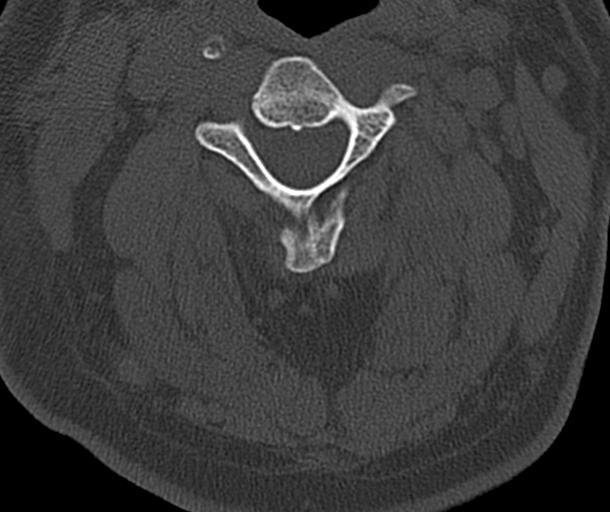

[10 of 33 positions shown; findings below may reference images not displayed]

FINDINGS: Alignment: Normal alignment of the cervical vertebrae and facet
joints. C1-2 articulation appears intact.

Skull base and vertebrae: Skull base appears intact. No vertebral
compression deformities. No focal bone lesion or bone destruction.
Bone cortex appears intact.

Soft tissues and spinal canal: No prevertebral soft tissue swelling.
No paraspinal soft tissue mass or infiltration.

Disc levels: Intervertebral disc space heights are preserved.
Hypertrophic degenerative changes in the facet joints. No definite
bony encroachment upon the central canal. If disc disease is
suspected, MRI would be more sensitive for evaluation.

Upper chest: Minimal emphysematous changes in the lung apices.

Other: None.
IMPRESSION: Normal alignment of the cervical spine. No acute displaced fractures
identified. Mild degenerative changes, mostly in the facet joints.

## 2023-06-01 ENCOUNTER — Emergency Department
Admission: EM | Admit: 2023-06-01 | Discharge: 2023-06-01 | Disposition: A | Discharge status: Home / Self Care | Payer: Managed Care, Other (non HMO) | Attending: Emergency Medicine | Admitting: Emergency Medicine

## 2023-06-01 ENCOUNTER — Other Ambulatory Visit: Payer: Self-pay

## 2023-06-01 DIAGNOSIS — K429 Umbilical hernia without obstruction or gangrene: Secondary | ICD-10-CM | POA: Diagnosis present

## 2023-06-01 DIAGNOSIS — H9201 Otalgia, right ear: Secondary | ICD-10-CM | POA: Diagnosis not present

## 2023-06-01 MED ORDER — AMOXICILLIN-POT CLAVULANATE 875-125 MG PO TABS: 1.0000 | ORAL_TABLET | Freq: Two times a day (BID) | ORAL | 0 refills | Status: AC

## 2023-06-01 NOTE — ED Provider Notes (Signed)
[facility] Provider Note  Patient Contact: 8:34 PM (approximate)   History   Hernia   HPI  Maurice Ramos is a 47 y.o. male presents to the emergency department with concern for a nonpainful umbilical hernia.  Patient reports that he has had hernia for "some time".  He has had no nausea, vomiting or fever but does state that umbilical hernia seems like it is getting bigger every year.  He would like an outpatient referral to general surgery.  He is also complaining of right ear pain.      Physical Exam   Triage Vital Signs: ED Triage Vitals  Enc Vitals Group     BP 06/01/23 1838 134/73     Pulse Rate 06/01/23 1838 99     Resp 06/01/23 1838 20     Temp 06/01/23 1840 98.4 F (36.9 C)     Temp Source 06/01/23 1840 Oral     SpO2 06/01/23 1838 97 %     Weight 06/01/23 1838 280 lb (127 kg)     Height 06/01/23 1838 5\' 9"  (1.753 m)     Head Circumference --      Peak Flow --      Pain Score 06/01/23 1838 0     Pain Loc --      Pain Edu? --      Excl. in GC? --     Most recent vital signs: Vitals:   06/01/23 1838 06/01/23 1840  BP: 134/73   Pulse: 99   Resp: 20   Temp:  98.4 F (36.9 C)  SpO2: 97%      General: Alert and in no acute distress. Eyes:  PERRL. EOMI. Head: No acute traumatic findings ENT:      Ears: Patient has signs of otitis media on the right.      Nose: No congestion/rhinnorhea.      Mouth/Throat: Mucous membranes are moist. Neck: No stridor. No cervical spine tenderness to palpation. Cardiovascular:  Good peripheral perfusion Respiratory: Normal respiratory effort without tachypnea or retractions. Lungs CTAB. Good air entry to the bases with no decreased or absent breath sounds. Gastrointestinal: Bowel sounds 4 quadrants. Soft and nontender to palpation. No guarding or rigidity. No palpable masses. No distention. No CVA tenderness. Musculoskeletal: Full range of motion to all extremities.  Neurologic:  No gross focal  neurologic deficits are appreciated.  Skin:   No rash noted   ED Results / Procedures / Treatments   Labs (all labs ordered are listed, but only abnormal results are displayed) Labs Reviewed - No data to display      PROCEDURES:  Critical Care performed: No  Procedures   MEDICATIONS ORDERED IN ED: Medications - No data to display   IMPRESSION / MDM / ASSESSMENT AND PLAN / ED COURSE  I reviewed the triage vital signs and the nursing notes.                              Assessment and plan Umbilical hernia Otitis media 47 year old male presents to the emergency department with an umbilical hernia.  Hernia is not erythematous and is soft to palpation without apparent pain.  Patient was referred to general surgery for nonemergent outpatient evaluation.  Patient was prescribed Augmentin for otitis media on the right.      FINAL CLINICAL IMPRESSION(S) / ED DIAGNOSES   Final diagnoses:  Umbilical hernia without obstruction and without gangrene  Rx / DC Orders   ED Discharge Orders          Ordered    amoxicillin-clavulanate (AUGMENTIN) 875-125 MG tablet  2 times daily        06/01/23 2000             Note:  This document was prepared using Dragon voice recognition software and may include unintentional dictation errors.   Maurice Ramos 06/01/23 2037    Jene Every, MD 06/02/23 [phone]

## 2023-06-01 NOTE — Discharge Instructions (Signed)
Take Augmentin twice daily for ten days.  

## 2023-06-01 NOTE — ED Triage Notes (Signed)
Pt to ED for hernia, states does not have PCP and was told to come here by [facility] for recommendations for surgery. Denies pain, states "it doesn't bother me, doesn't hurt at all".  Also states has sinus infection and could use some antibiotics.

## 2023-06-05 ENCOUNTER — Encounter: Payer: Self-pay | Admitting: Surgery

## 2023-06-05 ENCOUNTER — Telehealth: Payer: Self-pay | Admitting: Surgery

## 2023-06-05 ENCOUNTER — Ambulatory Visit (INDEPENDENT_AMBULATORY_CARE_PROVIDER_SITE_OTHER): Payer: Managed Care, Other (non HMO) | Admitting: Surgery

## 2023-06-05 VITALS — BP 148/95 | HR 111 | Temp 97.8°F | Ht 69.0 in | Wt 272.8 lb

## 2023-06-05 DIAGNOSIS — K420 Umbilical hernia with obstruction, without gangrene: Secondary | ICD-10-CM

## 2023-06-05 DIAGNOSIS — F17210 Nicotine dependence, cigarettes, uncomplicated: Secondary | ICD-10-CM | POA: Diagnosis not present

## 2023-06-05 DIAGNOSIS — Z6841 Body Mass Index (BMI) 40.0 and over, adult: Secondary | ICD-10-CM

## 2023-06-05 DIAGNOSIS — K429 Umbilical hernia without obstruction or gangrene: Secondary | ICD-10-CM

## 2023-06-05 NOTE — Patient Instructions (Addendum)
Our surgery scheduler Barbara will call you within 24-48 hours to get you scheduled. If you have not heard from her after 48 hours, please call our office. Have the blue sheet available when she calls to write down important information.   If you have any concerns or questions, please feel free to call our office.   Umbilical Hernia, Adult  A hernia is a bulge of tissue that pushes through an opening between muscles. An umbilical hernia happens in the abdomen, near the belly button (umbilicus). The hernia may contain tissues from the small intestine, large intestine, or fatty tissue covering the intestines. Umbilical hernias in adults tend to get worse over time, and they require surgical treatment. There are different types of umbilical hernias, including: Indirect hernia. This type is located just above or below the umbilicus. It is the most common type of umbilical hernia in adults. Direct hernia. This type forms through an opening formed by the umbilicus. Reducible hernia. This type of hernia comes and goes. It may be visible only when you strain, lift something heavy, or cough. This type of hernia can be pushed back into the abdomen (reduced). Incarcerated hernia. This type traps abdominal tissue inside the hernia. This type of hernia cannot be reduced. Strangulated hernia. This type of hernia cuts off blood flow to the tissues inside the hernia. The tissues can start to die if this happens. This type of hernia requires emergency treatment. What are the causes? An umbilical hernia happens when tissue inside the abdomen presses on a weak area of the abdominal muscles. What increases the risk? You may have a greater risk of this condition if you: Are obese. Have had several pregnancies. Have a buildup of fluid inside your abdomen. Have had surgery that weakens the abdominal muscles. What are the signs or symptoms? The main symptom of this condition is a painless bulge at or near the belly  button. A reducible hernia may be visible only when you strain, lift something heavy, or cough. Other symptoms may include: Dull pain. A feeling of pressure. Symptoms of a strangulated hernia may include: Pain that gets increasingly worse. Nausea and vomiting. Pain when pressing on the hernia. Skin over the hernia becoming red or purple. Constipation. Blood in the stool. How is this diagnosed? This condition may be diagnosed based on: A physical exam. You may be asked to cough or strain while standing. These actions increase the pressure inside your abdomen and can force the hernia through the opening in your muscles. Your health care provider may try to reduce the hernia by pressing on it. Your symptoms and medical history. How is this treated? Surgery is the only treatment for an umbilical hernia. Surgery for a strangulated hernia is done as soon as possible. If you have a small hernia that is not incarcerated, you may need to lose weight before having surgery. Follow these instructions at home: Lose weight, if told by your health care provider. Do not try to push the hernia back in. Watch your hernia for any changes in color or size. Tell your health care provider if any changes occur. You may need to avoid activities that increase pressure on your hernia. Do not lift anything that is heavier than 10 lb (4.5 kg), or the limit that you are told, until your health care provider says that it is safe. Take over-the-counter and prescription medicines only as told by your health care provider. Keep all follow-up visits. This is important. Contact a health care   provider if: Your hernia gets larger. Your hernia becomes painful. Get help right away if: You develop sudden, severe pain near the area of your hernia. You have pain as well as nausea or vomiting. You have pain and the skin over your hernia changes color. You develop a fever or chills. Summary A hernia is a bulge of tissue that  pushes through an opening between muscles. An umbilical hernia happens near the belly button. Surgery is the only treatment for an umbilical hernia. Do not try to push your hernia back in. Keep all follow-up visits. This is important. This information is not intended to replace advice given to you by your health care provider. Make sure you discuss any questions you have with your health care provider. Document Revised: 07/13/2020 Document Reviewed: 07/13/2020 Elsevier Patient Education  2024 [employer].

## 2023-06-05 NOTE — Telephone Encounter (Signed)
Unable to leave message, voice mailbox full.  If patient calls, please inform him of the following regarding scheduled surgery with Dr. Everlene Farrier.   Pre-Admission date/time, and Surgery date at [facility].  Surgery Date: 06/15/23 Preadmission Testing Date: 06/06/23 (phone 1p-4p)  Also patient will need to call at [phone], between 1-3:00pm the day before surgery, to find out what time to arrive for surgery.

## 2023-06-06 ENCOUNTER — Encounter
Admission: RE | Admit: 2023-06-06 | Discharge: 2023-06-06 | Disposition: A | Discharge status: Home / Self Care | Payer: Managed Care, Other (non HMO) | Source: Ambulatory Visit | Attending: Surgery | Admitting: Surgery

## 2023-06-06 ENCOUNTER — Other Ambulatory Visit: Payer: Self-pay

## 2023-06-06 HISTORY — DX: Personal history of urinary calculi: Z87.442

## 2023-06-06 HISTORY — DX: Other complications of anesthesia, initial encounter: T88.59XA

## 2023-06-06 NOTE — Progress Notes (Signed)
Patient ID: Maurice Ramos, male   DOB: 20-Apr-1976, 47 y.o.   MRN: [id number]  HPI Maurice Ramos is a 47 y.o. male seen in consultation at the request of Dr. Cyril Loosen for symptomatic umbilical hernia.  He reports that he has had hernia at the umbilicus level for several years over the last year increasing in size sometimes unable to push back in.  He experiences discomfort and mild pain that is intermittent sharp and worsening with Valsalva.  He works daily and his work is very labor-intensive.  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  Without complicating features He is able to perform more than 4 METS of activity without any shortness of breath or chest pain.  He is an active smoker  HPI  Past Medical History:  Diagnosis Date   GERD (gastroesophageal reflux disease)    Kidney stones     Past Surgical History:  Procedure Laterality Date   EYE SURGERY Left    HAND SURGERY Left     History reviewed. No pertinent family history.  Social History Social History   Tobacco Use   Smoking status: Every Day   Smokeless tobacco: Never  Substance Use Topics   Alcohol use: No    No Known Allergies  Current Outpatient Medications  Medication Sig Dispense Refill   amoxicillin-clavulanate (AUGMENTIN) 875-125 MG tablet Take 1 tablet by mouth 2 (two) times daily for 10 days. 20 tablet 0   naproxen (NAPROSYN) 500 MG tablet Take 1 tablet (500 mg total) by mouth 2 (two) times daily with a meal. 20 tablet 2   No current facility-administered medications for this visit.     Review of Systems Full ROS  was asked and was negative except for the information on the HPI  Physical Exam Blood pressure (!) 148/95, pulse (!) 111, temperature 97.8 F (36.6 C), temperature source Oral, height 5\' 9"  (1.753 m), weight 272 lb 12.8 oz (123.7 kg), SpO2 96 %. CONSTITUTIONAL: NAD. BMI  40 EYES: Pupils are equal, round,  Sclera are non-icteric. EARS, NOSE, MOUTH AND THROAT: The oropharynx is clear. The oral mucosa is pink and moist. Hearing is intact to voice. LYMPH NODES:  Lymph nodes in the neck are normal. RESPIRATORY:  Lungs are clear. There is normal respiratory effort, with equal breath sounds bilaterally, and without pathologic use of accessory muscles. CARDIOVASCULAR: Heart is regular without murmurs, gallops, or rubs. GI: The abdomen is  soft, and nondistended. Umbilical hernia 3-4 cms chronically incarcerated, Upon attempt of reduction he is tender. No peritonitis.There are no palpable masses. There is no hepatosplenomegaly. There are normal bowel sounds . GU: Rectal deferred.   MUSCULOSKELETAL: Normal muscle strength and tone. No cyanosis or edema.   SKIN: Turgor is good and there are no pathologic skin lesions or ulcers. NEUROLOGIC: Motor and sensation is grossly normal. Cranial nerves are grossly intact. PSYCH:  Oriented to person, place and time. Affect is normal.  Data Reviewed  I have personally reviewed the patient's imaging, laboratory findings and medical records.    Assessment/Plan 47 year old male with chronically incarcerated umbilical hernia with crescendo symptoms.  Discussed with patient in detail.  In an ideal circumstances we will improve risk factors I do not think that this is feasible in his case.  It is unlikely that he will quit smoking and I do not think that we can achieve  a good BMI within the next few weeks.  He does have worsening symptoms and significant issues related to his pain. Also discussed with him about referral to weight loss program..  Will make appointment with provider. An extensive discussion with the patient about the other option of proceeding to the operating room with repair of umbilical hernia.  Specifically I do think that given his BMI a robotic approach is superior to prevent complications and more importantly  recurrences.  Procedure discussed with the patient in detail.  Risks, benefits and possible complications including but not limited to: Bleeding, infection bowel injuries, recurrence and chronic pain.  He understands and wished to proceed. Copy of this report was sent to the referring provider    Sterling Big, MD FACS General Surgeon 06/06/2023, 9:38 AM

## 2023-06-06 NOTE — H&P (View-Only) (Signed)
Patient ID: Maurice Ramos, male   DOB: 12/12/1976, 46 y.o.   MRN: [id number]  HPI Maurice Ramos is a 46 y.o. male seen in consultation at the request of Dr. Kinner for symptomatic umbilical hernia.  He reports that he has had hernia at the umbilicus level for several years over the last year increasing in size sometimes unable to push back in.  He experiences discomfort and mild pain that is intermittent sharp and worsening with Valsalva.  He works daily and his work is very labor-intensive.  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  Without complicating features He is able to perform more than 4 METS of activity without any shortness of breath or chest pain.  He is an active smoker  HPI  Past Medical History:  Diagnosis Date   GERD (gastroesophageal reflux disease)    Kidney stones     Past Surgical History:  Procedure Laterality Date   EYE SURGERY Left    HAND SURGERY Left     History reviewed. No pertinent family history.  Social History Social History   Tobacco Use   Smoking status: Every Day   Smokeless tobacco: Never  Substance Use Topics   Alcohol use: No    No Known Allergies  Current Outpatient Medications  Medication Sig Dispense Refill   amoxicillin-clavulanate (AUGMENTIN) 875-125 MG tablet Take 1 tablet by mouth 2 (two) times daily for 10 days. 20 tablet 0   naproxen (NAPROSYN) 500 MG tablet Take 1 tablet (500 mg total) by mouth 2 (two) times daily with a meal. 20 tablet 2   No current facility-administered medications for this visit.     Review of Systems Full ROS  was asked and was negative except for the information on the HPI  Physical Exam Blood pressure (!) 148/95, pulse (!) 111, temperature 97.8 F (36.6 C), temperature source Oral, height 5' 9" (1.753 m), weight 272 lb 12.8 oz (123.7 kg), SpO2 96 %. CONSTITUTIONAL: NAD. BMI  40 EYES: Pupils are equal, round,  Sclera are non-icteric. EARS, NOSE, MOUTH AND THROAT: The oropharynx is clear. The oral mucosa is pink and moist. Hearing is intact to voice. LYMPH NODES:  Lymph nodes in the neck are normal. RESPIRATORY:  Lungs are clear. There is normal respiratory effort, with equal breath sounds bilaterally, and without pathologic use of accessory muscles. CARDIOVASCULAR: Heart is regular without murmurs, gallops, or rubs. GI: The abdomen is  soft, and nondistended. Umbilical hernia 3-4 cms chronically incarcerated, Upon attempt of reduction he is tender. No peritonitis.There are no palpable masses. There is no hepatosplenomegaly. There are normal bowel sounds . GU: Rectal deferred.   MUSCULOSKELETAL: Normal muscle strength and tone. No cyanosis or edema.   SKIN: Turgor is good and there are no pathologic skin lesions or ulcers. NEUROLOGIC: Motor and sensation is grossly normal. Cranial nerves are grossly intact. PSYCH:  Oriented to person, place and time. Affect is normal.  Data Reviewed  I have personally reviewed the patient's imaging, laboratory findings and medical records.    Assessment/Plan 46-year-old male with chronically incarcerated umbilical hernia with crescendo symptoms.  Discussed with patient in detail.  In an ideal circumstances we will improve risk factors I do not think that this is feasible in his case.  It is unlikely that he will quit smoking and I do not think that we can achieve   a good BMI within the next few weeks.  He does have worsening symptoms and significant issues related to his pain. Also discussed with him about referral to weight loss program..  Will make appointment with provider. An extensive discussion with the patient about the other option of proceeding to the operating room with repair of umbilical hernia.  Specifically I do think that given his BMI a robotic approach is superior to prevent complications and more importantly  recurrences.  Procedure discussed with the patient in detail.  Risks, benefits and possible complications including but not limited to: Bleeding, infection bowel injuries, recurrence and chronic pain.  He understands and wished to proceed. Copy of this report was sent to the referring provider    Maurice Munter, MD FACS General Surgeon 06/06/2023, 9:38 AM   

## 2023-06-06 NOTE — Telephone Encounter (Signed)
Outgoing call is made again, patient is now aware of all dates regarding his surgery.

## 2023-06-06 NOTE — Patient Instructions (Addendum)
Your procedure is scheduled on: 06/15/23 - Thursday Report to the Registration Desk on the 1st floor of the [facility]. To find out your arrival time, please call [phone] between 1PM - 3PM on: 06/14/23 - Wednesday If your arrival time is 6:00 am, do not arrive before that time as the [facility] entrance doors do not open until 6:00 am.  REMEMBER: Instructions that are not followed completely may result in serious medical risk, up to and including death; or upon the discretion of your surgeon and anesthesiologist your surgery may need to be rescheduled.  Do not eat food after midnight the night before surgery.  No gum chewing or hard candies.  You may however, drink CLEAR liquids up to 2 hours before you are scheduled to arrive for your surgery. Do not drink anything within 2 hours of your scheduled arrival time.  Clear liquids include: - water  - apple juice without pulp - gatorade (not RED colors) - black coffee or tea (Do NOT add milk or creamers to the coffee or tea) Do NOT drink anything that is not on this list.  One week prior to surgery: Stop Anti-inflammatories (NSAIDS) such as Advil, Aleve, Ibuprofen, Motrin, Naproxen, Naprosyn and Aspirin based products such as Excedrin, Goody's Powder, BC Powder.  Stop ANY OVER THE COUNTER supplements until after surgery.  You may take Tylenol if needed for pain up until the day of surgery.   TAKE ONLY THESE MEDICATIONS THE MORNING OF SURGERY WITH A SIP OF WATER:  Famotidine (PEPCID)   No Alcohol for 24 hours before or after surgery.  No Smoking including e-cigarettes for 24 hours before surgery.  No chewable tobacco products for at least 6 hours before surgery.  No nicotine patches on the day of surgery.  Do not use any "recreational" drugs for at least a week (preferably 2 weeks) before your surgery.  Please be advised that the combination of cocaine and anesthesia may have negative outcomes, up to and including  death. If you test positive for cocaine, your surgery will be cancelled.  On the morning of surgery brush your teeth with toothpaste and water, you may rinse your mouth with mouthwash if you wish. Do not swallow any toothpaste or mouthwash.  Do not wear jewelry, make-up, hairpins, clips or nail polish.  Do not wear lotions, powders, or perfumes.   Do not shave body hair from the neck down 48 hours before surgery.  Contact lenses, hearing aids and dentures may not be worn into surgery.  Do not bring valuables to the hospital. [facility] is not responsible for any missing/lost belongings or valuables.   Notify your doctor if there is any change in your medical condition (cold, fever, infection).  Wear comfortable clothing (specific to your surgery type) to the hospital.  After surgery, you can help prevent lung complications by doing breathing exercises.  Take deep breaths and cough every 1-2 hours. Your doctor may order a device called an Incentive Spirometer to help you take deep breaths. When coughing or sneezing, hold a pillow firmly against your incision with both hands. This is called "splinting." Doing this helps protect your incision. It also decreases belly discomfort.  If you are being admitted to the hospital overnight, leave your suitcase in the car. After surgery it may be brought to your room.  In case of increased patient census, it may be necessary for you, the patient, to continue your postoperative care in the Same Day Surgery department.  If  you are being discharged the day of surgery, you will not be allowed to drive home. You will need a responsible individual to drive you home and stay with you for 24 hours after surgery.   If you are taking public transportation, you will need to have a responsible individual with you.  Please call the Pre-admissions Testing Dept. at [phone] if you have any questions about these instructions.  Surgery Visitation  Policy:  Patients having surgery or a procedure may have two visitors.  Children under the age of 35 must have an adult with them who is not the patient.  Inpatient Visitation:    Visiting hours are 7 a.m. to 8 p.m. Up to four visitors are allowed at one time in a patient room. The visitors may rotate out with other people during the day.  One visitor age 40 or older may stay with the patient overnight and must be in the room by 8 p.m.

## 2023-06-15 ENCOUNTER — Other Ambulatory Visit: Payer: Self-pay

## 2023-06-15 ENCOUNTER — Encounter
Admission: RE | Disposition: A | Discharge status: Home / Self Care | Payer: Self-pay | Source: Home / Self Care | Attending: Surgery

## 2023-06-15 ENCOUNTER — Ambulatory Visit: Payer: Managed Care, Other (non HMO) | Admitting: Anesthesiology

## 2023-06-15 ENCOUNTER — Encounter: Payer: Self-pay | Admitting: Surgery

## 2023-06-15 ENCOUNTER — Ambulatory Visit
Admission: RE | Admit: 2023-06-15 | Discharge: 2023-06-15 | Disposition: A | Discharge status: Home / Self Care | Payer: Managed Care, Other (non HMO) | Attending: Surgery | Admitting: Surgery

## 2023-06-15 DIAGNOSIS — K420 Umbilical hernia with obstruction, without gangrene: Secondary | ICD-10-CM

## 2023-06-15 DIAGNOSIS — Z87442 Personal history of urinary calculi: Secondary | ICD-10-CM | POA: Insufficient documentation

## 2023-06-15 DIAGNOSIS — F17200 Nicotine dependence, unspecified, uncomplicated: Secondary | ICD-10-CM | POA: Diagnosis not present

## 2023-06-15 DIAGNOSIS — Z6841 Body Mass Index (BMI) 40.0 and over, adult: Secondary | ICD-10-CM | POA: Diagnosis not present

## 2023-06-15 DIAGNOSIS — K219 Gastro-esophageal reflux disease without esophagitis: Secondary | ICD-10-CM | POA: Diagnosis not present

## 2023-06-15 DIAGNOSIS — K429 Umbilical hernia without obstruction or gangrene: Secondary | ICD-10-CM

## 2023-06-15 HISTORY — PX: INSERTION OF MESH: SHX5868

## 2023-06-15 HISTORY — PX: XI ROBOTIC ASSISTED VENTRAL HERNIA: SHX6789

## 2023-06-15 SURGERY — REPAIR, HERNIA, VENTRAL, ROBOT-ASSISTED
Anesthesia: General

## 2023-06-15 MED ORDER — DEXTROSE 5 % IV SOLN
3.0000 g | INTRAVENOUS | Status: AC
  Administered 2023-06-15: 3 g via INTRAVENOUS
  Filled 2023-06-15: qty 3000

## 2023-06-15 MED ORDER — FENTANYL CITRATE (PF) 100 MCG/2ML IJ SOLN
INTRAMUSCULAR | Status: DC | PRN
  Administered 2023-06-15: 100 ug via INTRAVENOUS

## 2023-06-15 MED ORDER — PROPOFOL 10 MG/ML IV BOLUS
INTRAVENOUS | Status: AC
  Filled 2023-06-15: qty 40

## 2023-06-15 MED ORDER — MIDAZOLAM HCL 2 MG/2ML IJ SOLN
INTRAMUSCULAR | Status: AC
  Filled 2023-06-15: qty 2

## 2023-06-15 MED ORDER — MIDAZOLAM HCL 2 MG/2ML IJ SOLN
INTRAMUSCULAR | Status: DC | PRN
  Administered 2023-06-15: 2 mg via INTRAVENOUS

## 2023-06-15 MED ORDER — DEXAMETHASONE SODIUM PHOSPHATE 10 MG/ML IJ SOLN
INTRAMUSCULAR | Status: DC | PRN
  Administered 2023-06-15: 10 mg via INTRAVENOUS

## 2023-06-15 MED ORDER — PROPOFOL 10 MG/ML IV BOLUS
INTRAVENOUS | Status: DC | PRN
  Administered 2023-06-15: 50 mg via INTRAVENOUS
  Administered 2023-06-15: 70 mg via INTRAVENOUS
  Administered 2023-06-15: 50 mg via INTRAVENOUS
  Administered 2023-06-15: 200 mg via INTRAVENOUS

## 2023-06-15 MED ORDER — HYDROMORPHONE HCL 1 MG/ML IJ SOLN
INTRAMUSCULAR | Status: AC
  Filled 2023-06-15: qty 1

## 2023-06-15 MED ORDER — EPINEPHRINE PF 1 MG/ML IJ SOLN
INTRAMUSCULAR | Status: AC
  Filled 2023-06-15: qty 1

## 2023-06-15 MED ORDER — CELECOXIB 200 MG PO CAPS
200.0000 mg | ORAL_CAPSULE | ORAL | Status: AC
  Administered 2023-06-15: 200 mg via ORAL

## 2023-06-15 MED ORDER — LIDOCAINE HCL (CARDIAC) PF 100 MG/5ML IV SOSY
PREFILLED_SYRINGE | INTRAVENOUS | Status: DC | PRN
  Administered 2023-06-15: 100 mg via INTRAVENOUS

## 2023-06-15 MED ORDER — SUGAMMADEX SODIUM 200 MG/2ML IV SOLN
INTRAVENOUS | Status: DC | PRN
  Administered 2023-06-15 (×2): 200 mg via INTRAVENOUS

## 2023-06-15 MED ORDER — CHLORHEXIDINE GLUCONATE CLOTH 2 % EX PADS: 6.0000 | MEDICATED_PAD | Freq: Once | CUTANEOUS | Status: DC

## 2023-06-15 MED ORDER — ORAL CARE MOUTH RINSE: 15.0000 mL | Freq: Once | OROMUCOSAL | Status: AC

## 2023-06-15 MED ORDER — PHENYLEPHRINE HCL (PRESSORS) 10 MG/ML IV SOLN
INTRAVENOUS | Status: DC | PRN
  Administered 2023-06-15 (×2): 80 ug via INTRAVENOUS

## 2023-06-15 MED ORDER — LACTATED RINGERS IV SOLN: INTRAVENOUS | Status: DC

## 2023-06-15 MED ORDER — IPRATROPIUM-ALBUTEROL 0.5-2.5 (3) MG/3ML IN SOLN
RESPIRATORY_TRACT | Status: AC
  Filled 2023-06-15: qty 3

## 2023-06-15 MED ORDER — FENTANYL CITRATE (PF) 100 MCG/2ML IJ SOLN
INTRAMUSCULAR | Status: AC
  Filled 2023-06-15: qty 2

## 2023-06-15 MED ORDER — DEXMEDETOMIDINE HCL IN NACL 200 MCG/50ML IV SOLN
INTRAVENOUS | Status: DC | PRN
  Administered 2023-06-15: 8 ug via INTRAVENOUS
  Administered 2023-06-15: 12 ug via INTRAVENOUS
  Administered 2023-06-15: 8 ug via INTRAVENOUS
  Administered 2023-06-15: 12 ug via INTRAVENOUS

## 2023-06-15 MED ORDER — FAMOTIDINE 20 MG PO TABS
20.0000 mg | ORAL_TABLET | Freq: Once | ORAL | Status: AC
  Administered 2023-06-15: 20 mg via ORAL

## 2023-06-15 MED ORDER — BUPIVACAINE HCL (PF) 0.25 % IJ SOLN
INTRAMUSCULAR | Status: AC
  Filled 2023-06-15: qty 30

## 2023-06-15 MED ORDER — OXYCODONE HCL 5 MG PO TABS: 5.0000 mg | ORAL_TABLET | Freq: Once | ORAL | Status: DC | PRN

## 2023-06-15 MED ORDER — ACETAMINOPHEN 500 MG PO TABS
1000.0000 mg | ORAL_TABLET | ORAL | Status: AC
  Administered 2023-06-15: 1000 mg via ORAL

## 2023-06-15 MED ORDER — BUPIVACAINE-EPINEPHRINE 0.25% -1:200000 IJ SOLN
INTRAMUSCULAR | Status: DC | PRN
  Administered 2023-06-15: 30 mL

## 2023-06-15 MED ORDER — HYDROMORPHONE HCL 1 MG/ML IJ SOLN
INTRAMUSCULAR | Status: DC | PRN
  Administered 2023-06-15 (×4): .5 mg via INTRAVENOUS

## 2023-06-15 MED ORDER — HYDROCODONE-ACETAMINOPHEN 5-325 MG PO TABS
1.0000 | ORAL_TABLET | ORAL | 0 refills | Status: DC | PRN
Start: 2023-06-15 — End: 2023-06-29

## 2023-06-15 MED ORDER — CEFAZOLIN IN SODIUM CHLORIDE 3-0.9 GM/100ML-% IV SOLN
3.0000 g | INTRAVENOUS | Status: DC
  Filled 2023-06-15 (×2): qty 100

## 2023-06-15 MED ORDER — GABAPENTIN 300 MG PO CAPS
ORAL_CAPSULE | ORAL | Status: AC
  Filled 2023-06-15: qty 1

## 2023-06-15 MED ORDER — GABAPENTIN 300 MG PO CAPS
300.0000 mg | ORAL_CAPSULE | ORAL | Status: AC
  Administered 2023-06-15: 300 mg via ORAL

## 2023-06-15 MED ORDER — ACETAMINOPHEN 500 MG PO TABS
ORAL_TABLET | ORAL | Status: AC
  Filled 2023-06-15: qty 2

## 2023-06-15 MED ORDER — CHLORHEXIDINE GLUCONATE CLOTH 2 % EX PADS
6.0000 | MEDICATED_PAD | Freq: Once | CUTANEOUS | Status: AC
  Administered 2023-06-15: 6 via TOPICAL

## 2023-06-15 MED ORDER — ONDANSETRON HCL 4 MG/2ML IJ SOLN: 4.0000 mg | Freq: Once | INTRAMUSCULAR | Status: DC | PRN

## 2023-06-15 MED ORDER — BUPIVACAINE LIPOSOME 1.3 % IJ SUSP
INTRAMUSCULAR | Status: AC
  Filled 2023-06-15: qty 20

## 2023-06-15 MED ORDER — FENTANYL CITRATE (PF) 100 MCG/2ML IJ SOLN
25.0000 ug | INTRAMUSCULAR | Status: DC | PRN
  Administered 2023-06-15 (×2): 25 ug via INTRAVENOUS

## 2023-06-15 MED ORDER — IPRATROPIUM-ALBUTEROL 0.5-2.5 (3) MG/3ML IN SOLN
3.0000 mL | Freq: Once | RESPIRATORY_TRACT | Status: AC
  Administered 2023-06-15: 3 mL via RESPIRATORY_TRACT

## 2023-06-15 MED ORDER — PHENYLEPHRINE HCL-NACL 20-0.9 MG/250ML-% IV SOLN
INTRAVENOUS | Status: AC
  Filled 2023-06-15: qty 250

## 2023-06-15 MED ORDER — BUPIVACAINE LIPOSOME 1.3 % IJ SUSP
INTRAMUSCULAR | Status: DC | PRN
  Administered 2023-06-15: 20 mL

## 2023-06-15 MED ORDER — ROCURONIUM BROMIDE 100 MG/10ML IV SOLN
INTRAVENOUS | Status: DC | PRN
  Administered 2023-06-15: 20 mg via INTRAVENOUS
  Administered 2023-06-15: 10 mg via INTRAVENOUS
  Administered 2023-06-15: 50 mg via INTRAVENOUS
  Administered 2023-06-15: 20 mg via INTRAVENOUS

## 2023-06-15 MED ORDER — CHLORHEXIDINE GLUCONATE 0.12 % MT SOLN
15.0000 mL | Freq: Once | OROMUCOSAL | Status: AC
  Administered 2023-06-15: 15 mL via OROMUCOSAL

## 2023-06-15 MED ORDER — OXYCODONE HCL 5 MG/5ML PO SOLN: 5.0000 mg | Freq: Once | ORAL | Status: DC | PRN

## 2023-06-15 MED ORDER — FAMOTIDINE 20 MG PO TABS
ORAL_TABLET | ORAL | Status: AC
  Filled 2023-06-15: qty 1

## 2023-06-15 MED ORDER — LACTATED RINGERS IV SOLN: INTRAVENOUS | Status: DC | PRN

## 2023-06-15 MED ORDER — CHLORHEXIDINE GLUCONATE 0.12 % MT SOLN
OROMUCOSAL | Status: AC
  Filled 2023-06-15: qty 15

## 2023-06-15 MED ORDER — ONDANSETRON HCL 4 MG/2ML IJ SOLN
INTRAMUSCULAR | Status: DC | PRN
  Administered 2023-06-15: 4 mg via INTRAVENOUS

## 2023-06-15 MED ORDER — CELECOXIB 200 MG PO CAPS
ORAL_CAPSULE | ORAL | Status: AC
  Filled 2023-06-15: qty 1

## 2023-06-15 SURGICAL SUPPLY — 49 items
ADH SKN CLS APL DERMABOND .7 (GAUZE/BANDAGES/DRESSINGS) ×4
COVER TIP SHEARS 8 DVNC (MISCELLANEOUS) ×2 IMPLANT
COVER WAND RF STERILE (DRAPES) ×2 IMPLANT
DERMABOND ADVANCED .7 DNX12 (GAUZE/BANDAGES/DRESSINGS) ×2 IMPLANT
DRAPE ARM DVNC X/XI (DISPOSABLE) ×6 IMPLANT
DRAPE COLUMN DVNC XI (DISPOSABLE) ×2 IMPLANT
ELECT CAUTERY BLADE 6.4 (BLADE) ×2 IMPLANT
ELECT REM PT RETURN 9FT ADLT (ELECTROSURGICAL) ×2
ELECTRODE REM PT RTRN 9FT ADLT (ELECTROSURGICAL) ×2 IMPLANT
FORCEPS BPLR R/ABLATION 8 DVNC (INSTRUMENTS) ×2 IMPLANT
GLOVE BIO SURGEON STRL SZ7 (GLOVE) ×4 IMPLANT
GOWN STRL REUS W/ TWL LRG LVL3 (GOWN DISPOSABLE) ×6 IMPLANT
GOWN STRL REUS W/TWL LRG LVL3 (GOWN DISPOSABLE) ×8
GRASPER SUT TROCAR 14GX15 (MISCELLANEOUS) ×2 IMPLANT
IRRIGATION STRYKERFLOW (MISCELLANEOUS) IMPLANT
IRRIGATOR STRYKERFLOW (MISCELLANEOUS)
IV NS 1000ML (IV SOLUTION)
IV NS 1000ML BAXH (IV SOLUTION) IMPLANT
KIT PINK PAD W/HEAD ARE REST (MISCELLANEOUS) ×2
KIT PINK PAD W/HEAD ARM REST (MISCELLANEOUS) ×2 IMPLANT
LABEL OR SOLS (LABEL) ×2 IMPLANT
MANIFOLD NEPTUNE II (INSTRUMENTS) ×2 IMPLANT
MESH SOFT 12X12IN BARD (Mesh General) IMPLANT
NDL DRIVE SUT CUT DVNC (INSTRUMENTS) ×2 IMPLANT
NDL HYPO 22X1.5 SAFETY MO (MISCELLANEOUS) ×2 IMPLANT
NDL INSUFFLATION 14GA 120MM (NEEDLE) ×2 IMPLANT
NEEDLE DRIVE SUT CUT DVNC (INSTRUMENTS) ×2 IMPLANT
NEEDLE HYPO 22X1.5 SAFETY MO (MISCELLANEOUS) ×2 IMPLANT
NEEDLE INSUFFLATION 14GA 120MM (NEEDLE) ×2 IMPLANT
OBTURATOR OPTICAL STND 8 DVNC (TROCAR) ×2
OBTURATOR OPTICALSTD 8 DVNC (TROCAR) ×2 IMPLANT
PACK LAP CHOLECYSTECTOMY (MISCELLANEOUS) ×2 IMPLANT
PENCIL SMOKE EVACUATOR (MISCELLANEOUS) ×2 IMPLANT
SCISSORS MNPLR CVD DVNC XI (INSTRUMENTS) ×2 IMPLANT
SEAL UNIV 5-12 XI (MISCELLANEOUS) ×6 IMPLANT
SET TUBE SMOKE EVAC HIGH FLOW (TUBING) ×2 IMPLANT
SOL ELECTROSURG ANTI STICK (MISCELLANEOUS) ×2
SOLUTION ELECTROSURG ANTI STCK (MISCELLANEOUS) ×2 IMPLANT
SPONGE T-LAP 18X18 ~~LOC~~+RFID (SPONGE) ×2 IMPLANT
SUT MNCRL 4-0 (SUTURE) ×2
SUT MNCRL 4-0 27XMFL (SUTURE) ×2
SUT STRATAFIX PDS 30 CT-1 (SUTURE) ×2 IMPLANT
SUT VICRYL 0 UR6 27IN ABS (SUTURE) IMPLANT
SUT VLOC 90 2/L VL 12 GS22 (SUTURE) ×2 IMPLANT
SUTURE MNCRL 4-0 27XMF (SUTURE) ×2 IMPLANT
SYR 20ML LL LF (SYRINGE) ×2 IMPLANT
TAPE TRANSPORE STRL 2 31045 (GAUZE/BANDAGES/DRESSINGS) ×2 IMPLANT
TRAP FLUID SMOKE EVACUATOR (MISCELLANEOUS) ×2 IMPLANT
WATER STERILE IRR 500ML POUR (IV SOLUTION) ×2 IMPLANT

## 2023-06-15 NOTE — Transfer of Care (Signed)
Immediate Anesthesia Transfer of Care Note  Patient: Maurice Ramos  Procedure(s) Performed: XI ROBOTIC ASSISTED VENTRAL HERNIA INSERTION OF MESH  Patient Location: PACU  Anesthesia Type:General  Level of Consciousness: drowsy  Airway & Oxygen Therapy: Patient Spontanous Breathing and Patient connected to face mask oxygen  Post-op Assessment: Report given to RN, Post -op Vital signs reviewed and stable, and Post -op Vital signs reviewed and unstable, Anesthesiologist notified  Post vital signs: Reviewed  Last Vitals:  Vitals Value Taken Time  BP 156/95 06/15/23 1030  Temp 36.3 C 06/15/23 1026  Pulse 79 06/15/23 1038  Resp 16 06/15/23 1038  SpO2 86 % 06/15/23 1038  Vitals shown include unvalidated device data.  Last Pain:  Vitals:   06/15/23 0622  TempSrc: Oral  PainSc: 0-No pain         Complications: No notable events documented.

## 2023-06-15 NOTE — Interval H&P Note (Signed)
History and Physical Interval Note:  06/15/2023 7:21 AM  Maurice Ramos  has presented today for surgery, with the diagnosis of umbilical hernia 3 cm.  The various methods of treatment have been discussed with the patient and family. After consideration of risks, benefits and other options for treatment, the patient has consented to  Procedure(s): XI ROBOTIC ASSISTED VENTRAL HERNIA (N/A) INSERTION OF MESH as a surgical intervention.  The patient's history has been reviewed, patient examined, no change in status, stable for surgery.  I have reviewed the patient's chart and labs.  Questions were answered to the patient's satisfaction.     Stpehen Petitjean F Verner Mccrone

## 2023-06-15 NOTE — Discharge Instructions (Addendum)
Laparoscopic Ventral Hernia Repair, Care After The following information offers guidance on how to care for yourself after your procedure. Your health care provider may also give you more specific instructions. If you have problems or questions, contact your health care provider. What can I expect after the procedure? After the procedure, it is common to have pain, discomfort, or soreness. Follow these instructions at home: Medicines Take over-the-counter and prescription medicines only as told by your health care provider. Ask your health care provider if the medicine prescribed to you: Requires you to avoid driving or using machinery. Can cause constipation. You may need to take these actions to prevent or treat constipation: Drink enough fluid to keep your urine pale yellow. Take over-the-counter or prescription medicines. Eat foods that are high in fiber, such as beans, whole grains, and fresh fruits and vegetables. Limit foods that are high in fat and processed sugars, such as fried or sweet foods. Incision care  Follow instructions from your health care provider about how to take care of your incisions. Make sure you: Wash your hands with soap and water for at least 20 seconds before and after you change your bandage (dressing) or before you touch your abdomen. If soap and water are not available, use hand sanitizer. Change your dressing as told by your health care provider. Leave stitches (sutures), skin glue, or adhesive strips in place. These skin closures may need to stay in place for 2 weeks or longer. If adhesive strip edges start to loosen and curl up, you may trim the loose edges. Do not remove adhesive strips completely unless your health care provider tells you to do that. Check your incision areas every day for signs of infection. Check for: More redness, swelling, or pain. Fluid or blood. Warmth. Pus or a bad smell. Bathing  Do not take baths, swim, or use a hot tub until  your health care provider approves. Ask your health care provider if you may take showers. You may only be allowed to take sponge baths. Keep your dressing dry until your health care provider says it can be removed. Activity  Rest as told by your health care provider. Avoid sitting for a long time without moving. Get up to take short walks every 1-2 hours. This is important to improve blood flow and breathing. Ask for help if you feel weak or unsteady. Do not lift anything that is heavier than 20 lb , or the limit that you are told, until your health care provider says that it is safe. If you were given a sedative during the procedure, it can affect you for several hours. Do not drive or operate machinery until your health care provider says that it is safe. Return to your normal activities as told by your health care provider. Ask your health care provider what activities are safe for you. General instructions  Hold a pillow over your abdomen when you cough or sneeze. This helps with pain. Do not use any products that contain nicotine or tobacco. These products include cigarettes, chewing tobacco, and vaping devices, such as e-cigarettes. These can delay healing after surgery. If you need help quitting, ask your health care provider. You may be asked to continue to do deep breathing exercises at home. This will help to prevent a lung infection. Keep all follow-up visits. This is important. Contact a health care provider if: You have any of these signs of infection: More redness, swelling, or pain around an incision. Fluid or blood coming  from an incision. Warmth coming from an incision. Pus or a bad smell coming from an incision. A fever or chills. You have pain that gets worse or does not get better with medicine. You have nausea or vomiting. You have a cough. You have shortness of breath. You have not had a bowel movement in 3 days. You are not able to urinate. Get help right away if  you have: Severe pain in your abdomen. Persistent nausea and vomiting. Redness, warmth, or pain in your leg. Chest pain. Trouble breathing. These symptoms may represent a serious problem that is an emergency. Do not wait to see if the symptoms will go away. Get medical help right away. Call your local emergency services (911 in the [place] Do not drive yourself to the hospital. Summary After this procedure, it is common to have pain, discomfort, or soreness. Follow instructions from your health care provider about how to take care of your incision. Check your incision area every day for signs of infection. Report any signs of infection to your health care provider. Keep all follow-up visits. This is important. This information is not intended to replace advice given to you by your health care provider. Make sure you discuss any questions you have with your health care provider. Document Revised: 07/24/2020 Document Reviewed: 07/24/2020 Elsevier Patient Education  2024 [employer]     AMBULATORY SURGERY  DISCHARGE INSTRUCTIONS   The drugs that you were given will stay in your system until tomorrow so for the next 24 hours you should not:  Drive an automobile Make any legal decisions Drink any alcoholic beverage   You may resume regular meals tomorrow.  Today it is better to start with liquids and gradually work up to solid foods.  You may eat anything you prefer, but it is better to start with liquids, then soup and crackers, and gradually work up to solid foods.   Please notify your doctor immediately if you have any unusual bleeding, trouble breathing, redness and pain at the surgery site, drainage, fever, or pain not relieved by medication.    Additional Instructions:        Please contact your physician with any problems or Same Day Surgery at [phone], Monday through Friday 6 am to 4 pm, or [place] at [facility] number at [phone].

## 2023-06-15 NOTE — Anesthesia Procedure Notes (Signed)
Procedure Name: Intubation Date/Time: 06/15/2023 7:37 AM  Performed by: Merlene Pulling, CRNAPre-anesthesia Checklist: Patient identified, Patient being monitored, Timeout performed, Emergency Drugs available and Suction available Patient Re-evaluated:Patient Re-evaluated prior to induction Oxygen Delivery Method: Circle system utilized Preoxygenation: Pre-oxygenation with 100% oxygen Induction Type: IV induction Ventilation: Mask ventilation without difficulty Laryngoscope Size: McGraph and 4 Grade View: Grade I Tube type: Oral Tube size: 7.5 mm Number of attempts: 1 Airway Equipment and Method: Stylet Placement Confirmation: ETT inserted through vocal cords under direct vision, positive ETCO2 and breath sounds checked- equal and bilateral Secured at: 25 cm Tube secured with: Tape Dental Injury: Teeth and Oropharynx as per pre-operative assessment

## 2023-06-15 NOTE — Anesthesia Preprocedure Evaluation (Signed)
Anesthesia Evaluation  Patient identified by MRN, date of birth, ID band Patient awake    Reviewed: Allergy & Precautions, NPO status , Patient's Chart, lab work & pertinent test results  History of Anesthesia Complications Negative for: history of anesthetic complications  Airway Mallampati: IV  TM Distance: >3 FB Neck ROM: Full    Dental  (+) Implants   Pulmonary neg sleep apnea, neg COPD, Current SmokerPatient did not abstain from smoking.   Pulmonary exam normal breath sounds clear to auscultation       Cardiovascular Exercise Tolerance: Good METS(-) hypertension(-) CAD and (-) Past MI negative cardio ROS (-) dysrhythmias  Rhythm:Regular Rate:Normal - Systolic murmurs    Neuro/Psych negative neurological ROS  negative psych ROS   GI/Hepatic ,GERD  Medicated and Controlled,,(+)     (-) substance abuse    Endo/Other  neg diabetes  Morbid obesity  Renal/GU negative Renal ROS     Musculoskeletal   Abdominal  (+) + obese  Peds  Hematology   Anesthesia Other Findings Past Medical History: No date: Complication of anesthesia     Comment:  during colonoscopy kept waking up No date: GERD (gastroesophageal reflux disease) No date: History of kidney stones No date: Kidney stones  Reproductive/Obstetrics                             Anesthesia Physical Anesthesia Plan  ASA: 3  Anesthesia Plan: General   Post-op Pain Management: Tylenol PO (pre-op)*, Celebrex PO (pre-op)* and Gabapentin PO (pre-op)*   Induction: Intravenous  PONV Risk Score and Plan: 2 and Ondansetron, Dexamethasone and Midazolam  Airway Management Planned: Oral ETT and Video Laryngoscope Planned  Additional Equipment: None  Intra-op Plan:   Post-operative Plan: Extubation in OR  Informed Consent: I have reviewed the patients History and Physical, chart, labs and discussed the procedure including the risks,  benefits and alternatives for the proposed anesthesia with the patient or authorized representative who has indicated his/her understanding and acceptance.     Dental advisory given  Plan Discussed with: CRNA and Surgeon  Anesthesia Plan Comments: (Discussed risks of anesthesia with patient, including PONV, sore throat, lip/dental/eye damage. Rare risks discussed as well, such as cardiorespiratory and neurological sequelae, and allergic reactions. Discussed the role of CRNA in patient's perioperative care. Patient understands. Patient counseled on benefits of smoking cessation, and increased perioperative risks associated with continued smoking. )       Anesthesia Quick Evaluation

## 2023-06-15 NOTE — Anesthesia Postprocedure Evaluation (Signed)
Anesthesia Post Note  Patient: Maurice Ramos  Procedure(s) Performed: XI ROBOTIC ASSISTED VENTRAL HERNIA INSERTION OF MESH  Patient location during evaluation: PACU Anesthesia Type: General Level of consciousness: awake and alert Pain management: pain level controlled Vital Signs Assessment: post-procedure vital signs reviewed and stable Respiratory status: spontaneous breathing, nonlabored ventilation and respiratory function stable Cardiovascular status: blood pressure returned to baseline and stable Postop Assessment: no apparent nausea or vomiting Anesthetic complications: yes Comments: Patient with likely undiagnosed OSA. Awake and talking to me, unlabored respirations, SpO2 94-96%. When dozes off, apparently dips down to low 80s%. Likely happens every night unbeknownst to him. I advised patient to find a primary care physician and get evaluated with a sleep study. Otherwise patient meets aldrete criteria for discharge.   Encounter Notable Events  Notable Event Outcome Phase Comment  Airway obstruction requiring support  Postprocedure, before discharge Needed BiPAP in PACU for obstructive respiration     Last Vitals:  Vitals:   06/15/23 1245 06/15/23 1300  BP: 114/61 (!) 144/95  Pulse: 74 68  Resp: 11 13  Temp:  (!) 36.4 C  SpO2: 93% 94%    Last Pain:  Vitals:   06/15/23 1300  TempSrc:   PainSc: 0-No pain                 Corinda Gubler

## 2023-06-15 NOTE — Op Note (Signed)
Robotic transabdominal preperitoneal repair (rTAPP) ventral hernia Repair  using  15x15 cms BARD soft macroporus midweight mesh  Pre-operative Diagnosis: ventral umbilical hernia   Post-operative Diagnosis: same   Surgeon: Sterling Big, MD FACS   Anesthesia: Gen. with endotracheal tube     Findings: 4 cms ventral hernia w incarcerated omentum   Estimated Blood Loss: 5  cc         Complications: none             Procedure Details  The patient was seen again in the Holding Room. The benefits, complications, treatment options, and expected outcomes were discussed with the patient. The risks of bleeding, infection, recurrence of symptoms, failure to resolve symptoms, bowel injury, mesh placement, mesh infection, any of which could require further surgery were reviewed with the patient. The likelihood of improving the patient's symptoms with return to their baseline status is good.  The patient and/or family concurred with the proposed plan, giving informed consent.  The patient was taken to Operating Room, identified and the procedure verified.  A Time Out was held and the above information confirmed.   Prior to the induction of general anesthesia, antibiotic prophylaxis was administered. VTE prophylaxis was in place. General endotracheal anesthesia was then administered and tolerated well. After the induction, the abdomen was prepped with Chloraprep and draped in the sterile fashion. The patient was positioned in the supine position.   We used a left upper quadrant subcostal incision to introduce veres needle at Palmer's point.  Saline drop test was appropiate and pneumoperitoneum obtained.  No hemodynamic compromise.   3 additional 8 mm ports were placed under direct visualization.  I visualized the hernia with chronic incarcerated omentum .  I thought this was a great case for rTAPP and to use midweight macroporus mesh due to obesity and risks factors.   The robot was brought to the surgical  field and docked in the standard fashion.  We made sure that all instrumentation was kept under direct vision at all times and there was no collision between the arms.  I scrubbed out and went to the console. I reduced the omentum back to the abdominal cavity  with graspers, I  Visualized the hernia defect and did consider that this was a good approach for preperitoneal placement. Measured the Defect 4 cms  I created a flap of the peritoneum on the left abdominal wall in the same  oval fashion.  We develop an extraperitoneal plane, the plane is developed between the the posterior rectus sheath and the peritoneum dorsally and bilaterally.  We created a large flap and  a single retroperitoneal compartment.  All the hernia contents were reduced At this time I went ahead and inserted the  mesh and needles under direct visualization. Please note that I used a 30x30 cm mesh and cut it to fit the appropriate created space. 0 Stratafix suture was used to close the defect.  The BARD mesh was placed laying against the post rectus sheath  bilaterally to have a good overlap of the defect.  It laid very nice against the abdominal wall. V lock was used to secured it to the abd wall. The peritoneum flap was closed with a running V lock suture in the standard fashion. A second look laparoscopy revealed no evidence of intra-abdominal injury.    All the needles and foreign objects were removed under direct visualization.  The instruments were removed and the robot was undocked.  I scrubbed back in, The  laparoscopic ports were removed under direct visualization and the pneumoperitoneum was deflated.   Incisions were closed with  4-0 Monocryl  And the fascial sutures approximated in the standard fashion Dermabond was used to coat the skin.  Liposomal Marcaine  was used to inject all the incision sites. Patient tolerated procedure well and there were no immediate complications. Needle and laparotomy counts were correct     Sterling Big, MD, FACS

## 2023-06-16 ENCOUNTER — Encounter: Payer: Self-pay | Admitting: Surgery

## 2023-06-19 ENCOUNTER — Other Ambulatory Visit: Payer: Self-pay

## 2023-06-19 ENCOUNTER — Encounter: Payer: Self-pay | Admitting: Radiology

## 2023-06-19 ENCOUNTER — Inpatient Hospital Stay
Admission: EM | Admit: 2023-06-19 | Discharge: 2023-06-21 | DRG: 322 | Disposition: A | Discharge status: Home / Self Care | Payer: Managed Care, Other (non HMO) | Attending: Internal Medicine | Admitting: Internal Medicine

## 2023-06-19 ENCOUNTER — Emergency Department: Payer: Managed Care, Other (non HMO)

## 2023-06-19 DIAGNOSIS — N179 Acute kidney failure, unspecified: Secondary | ICD-10-CM | POA: Diagnosis present

## 2023-06-19 DIAGNOSIS — Z6841 Body Mass Index (BMI) 40.0 and over, adult: Secondary | ICD-10-CM | POA: Diagnosis not present

## 2023-06-19 DIAGNOSIS — E782 Mixed hyperlipidemia: Secondary | ICD-10-CM

## 2023-06-19 DIAGNOSIS — D72829 Elevated white blood cell count, unspecified: Secondary | ICD-10-CM | POA: Diagnosis present

## 2023-06-19 DIAGNOSIS — N189 Chronic kidney disease, unspecified: Secondary | ICD-10-CM

## 2023-06-19 DIAGNOSIS — N182 Chronic kidney disease, stage 2 (mild): Secondary | ICD-10-CM | POA: Diagnosis present

## 2023-06-19 DIAGNOSIS — I129 Hypertensive chronic kidney disease with stage 1 through stage 4 chronic kidney disease, or unspecified chronic kidney disease: Secondary | ICD-10-CM | POA: Diagnosis present

## 2023-06-19 DIAGNOSIS — R7309 Other abnormal glucose: Secondary | ICD-10-CM | POA: Diagnosis not present

## 2023-06-19 DIAGNOSIS — K219 Gastro-esophageal reflux disease without esophagitis: Secondary | ICD-10-CM | POA: Diagnosis present

## 2023-06-19 DIAGNOSIS — E1122 Type 2 diabetes mellitus with diabetic chronic kidney disease: Secondary | ICD-10-CM | POA: Diagnosis present

## 2023-06-19 DIAGNOSIS — F17210 Nicotine dependence, cigarettes, uncomplicated: Secondary | ICD-10-CM | POA: Diagnosis present

## 2023-06-19 DIAGNOSIS — I2510 Atherosclerotic heart disease of native coronary artery without angina pectoris: Secondary | ICD-10-CM | POA: Diagnosis present

## 2023-06-19 DIAGNOSIS — D751 Secondary polycythemia: Secondary | ICD-10-CM | POA: Diagnosis present

## 2023-06-19 DIAGNOSIS — I214 Non-ST elevation (NSTEMI) myocardial infarction: Secondary | ICD-10-CM | POA: Diagnosis not present

## 2023-06-19 DIAGNOSIS — E785 Hyperlipidemia, unspecified: Secondary | ICD-10-CM | POA: Diagnosis present

## 2023-06-19 DIAGNOSIS — R079 Chest pain, unspecified: Secondary | ICD-10-CM | POA: Diagnosis present

## 2023-06-19 DIAGNOSIS — Z87442 Personal history of urinary calculi: Secondary | ICD-10-CM | POA: Diagnosis not present

## 2023-06-19 DIAGNOSIS — Z720 Tobacco use: Secondary | ICD-10-CM

## 2023-06-19 DIAGNOSIS — R030 Elevated blood-pressure reading, without diagnosis of hypertension: Secondary | ICD-10-CM | POA: Diagnosis not present

## 2023-06-19 LAB — BASIC METABOLIC PANEL
Anion gap: 12 (ref 5–15)
BUN: 16 mg/dL (ref 6–20)
CO2: 27 mmol/L (ref 22–32)
Calcium: 11.5 mg/dL — ABNORMAL HIGH (ref 8.9–10.3)
Chloride: 99 mmol/L (ref 98–111)
Creatinine, Ser: 1.37 mg/dL — ABNORMAL HIGH (ref 0.61–1.24)
GFR, Estimated: >60 mL/min (ref >60)
Glucose, Bld: 135 mg/dL — ABNORMAL HIGH (ref 70–99)
Potassium: 4.2 mmol/L (ref 3.5–5.1)
Sodium: 138 mmol/L (ref 135–145)

## 2023-06-19 LAB — CBC
HCT: 51.9 % (ref 39.0–52.0)
Hemoglobin: 17.9 g/dL — ABNORMAL HIGH (ref 13.0–17.0)
MCH: 32.7 pg (ref 26.0–34.0)
MCHC: 34.5 g/dL (ref 30.0–36.0)
MCV: 94.9 fL (ref 80.0–100.0)
Platelets: 267 10*3/uL (ref 150–400)
RBC: 5.47 MIL/uL (ref 4.22–5.81)
RDW: 12.8 % (ref 11.5–15.5)
WBC: 14 10*3/uL — ABNORMAL HIGH (ref 4.0–10.5)
nRBC: 0 % (ref 0.0–0.2)

## 2023-06-19 LAB — CBG MONITORING, ED: Glucose-Capillary: 99 mg/dL (ref 70–99)

## 2023-06-19 LAB — TROPONIN I (HIGH SENSITIVITY)
Troponin I (High Sensitivity): 2186 ng/L (ref <18)
Troponin I (High Sensitivity): 3765 ng/L (ref <18)

## 2023-06-19 LAB — D-DIMER, QUANTITATIVE: D-Dimer, Quant: 0.63 ug/mL-FEU — ABNORMAL HIGH (ref 0.00–0.50)

## 2023-06-19 LAB — PROTIME-INR
INR: 1.1 (ref 0.8–1.2)
Prothrombin Time: 14 seconds (ref 11.4–15.2)

## 2023-06-19 LAB — APTT: aPTT: 31 seconds (ref 24–36)

## 2023-06-19 MED ORDER — ACETAMINOPHEN 325 MG PO TABS: 650.0000 mg | ORAL_TABLET | ORAL | Status: DC | PRN

## 2023-06-19 MED ORDER — LABETALOL HCL 5 MG/ML IV SOLN: 10.0000 mg | Freq: Two times a day (BID) | INTRAVENOUS | Status: DC | PRN

## 2023-06-19 MED ORDER — HEPARIN BOLUS VIA INFUSION
4000.0000 [IU] | Freq: Once | INTRAVENOUS | Status: AC
  Administered 2023-06-19: 4000 [IU] via INTRAVENOUS
  Filled 2023-06-19: qty 4000

## 2023-06-19 MED ORDER — LACTATED RINGERS IV SOLN: INTRAVENOUS | Status: AC

## 2023-06-19 MED ORDER — IOHEXOL 350 MG/ML SOLN
100.0000 mL | Freq: Once | INTRAVENOUS | Status: AC | PRN
  Administered 2023-06-19: 100 mL via INTRAVENOUS

## 2023-06-19 MED ORDER — ATORVASTATIN CALCIUM 20 MG PO TABS
40.0000 mg | ORAL_TABLET | Freq: Every day | ORAL | Status: DC
  Administered 2023-06-19 – 2023-06-21 (×3): 40 mg via ORAL
  Filled 2023-06-19 (×3): qty 2

## 2023-06-19 MED ORDER — METOPROLOL TARTRATE 25 MG PO TABS
12.5000 mg | ORAL_TABLET | Freq: Two times a day (BID) | ORAL | Status: DC
  Administered 2023-06-19 – 2023-06-21 (×4): 12.5 mg via ORAL
  Filled 2023-06-19 (×4): qty 1

## 2023-06-19 MED ORDER — HYDROCODONE-ACETAMINOPHEN 5-325 MG PO TABS
1.0000 | ORAL_TABLET | ORAL | Status: DC | PRN
  Administered 2023-06-20: 2 via ORAL
  Administered 2023-06-20 – 2023-06-21 (×2): 1 via ORAL
  Filled 2023-06-19 (×2): qty 1

## 2023-06-19 MED ORDER — HEPARIN (PORCINE) 25000 UT/250ML-% IV SOLN
1800.0000 [IU]/h | INTRAVENOUS | Status: DC
  Administered 2023-06-19: 1400 [IU]/h via INTRAVENOUS
  Filled 2023-06-19 (×2): qty 250

## 2023-06-19 MED ORDER — FAMOTIDINE 20 MG PO TABS: 20.0000 mg | ORAL_TABLET | ORAL | Status: DC | PRN

## 2023-06-19 MED ORDER — NITROGLYCERIN 0.4 MG SL SUBL
0.4000 mg | SUBLINGUAL_TABLET | SUBLINGUAL | Status: DC | PRN
  Administered 2023-06-20: 0.4 mg via SUBLINGUAL

## 2023-06-19 MED ORDER — NICOTINE POLACRILEX 2 MG MT GUM: 2.0000 mg | CHEWING_GUM | OROMUCOSAL | Status: DC | PRN

## 2023-06-19 MED ORDER — ASPIRIN 81 MG PO CHEW
324.0000 mg | CHEWABLE_TABLET | Freq: Once | ORAL | Status: AC
  Administered 2023-06-19: 324 mg via ORAL
  Filled 2023-06-19: qty 4

## 2023-06-19 MED ORDER — ASPIRIN 81 MG PO TBEC
81.0000 mg | DELAYED_RELEASE_TABLET | Freq: Every day | ORAL | Status: DC
  Administered 2023-06-20 – 2023-06-21 (×2): 81 mg via ORAL
  Filled 2023-06-19 (×2): qty 1

## 2023-06-19 MED ORDER — ONDANSETRON HCL 4 MG/2ML IJ SOLN: 4.0000 mg | Freq: Four times a day (QID) | INTRAMUSCULAR | Status: DC | PRN

## 2023-06-19 NOTE — Assessment & Plan Note (Signed)
Urinalysis, including protein, creatinine, kidney ureter bladder ultrasound

## 2023-06-19 NOTE — Assessment & Plan Note (Signed)
Due to NSTEMI. No clnical concern of infection at this time, folw up UA

## 2023-06-19 NOTE — ED Triage Notes (Signed)
Pt states he had surgery to repair his umbilical hernia on Thursday. Today he started having chest pain that runs down his left arm. Pt states he has felt like this before and was put on prednisone and it went away. He states he feels like his shoulder blade has fluid on it.

## 2023-06-19 NOTE — Assessment & Plan Note (Signed)
Check erythropoietin level, likely due to tobacco abuse.  Monitor

## 2023-06-19 NOTE — Assessment & Plan Note (Signed)
Acute coronary syndrome order set applied.  Heparin infusion, aspirin statin, beta-blocker.  Use labetalol to keep SBP under 140 mmHg.  Lipid panel pending in a.m., echo pending in a.m.  Currently chest pain-free.

## 2023-06-19 NOTE — ED Notes (Signed)
Critical troponin 2186 results given to Dr. Larinda Buttery

## 2023-06-19 NOTE — ED Provider Notes (Signed)
[facility] Provider Note    Event Date/Time   First MD Initiated Contact with Patient 06/19/23 1833     (approximate)   History   Chief Complaint Chest Pain   HPI  Maurice Ramos is a 47 y.o. male with past medical history of kidney stones and GERD who presents to the ED complaining of chest pain.  Patient reports that earlier today he developed sharp pain coming across both sides of his chest and back, radiating into both arms.  He states it feels similar to pain he is experienced in the past and attributes it to nerve irritation that was previously treated with prednisone.  He denies any numbness or weakness in either arm, currently denies any pain in his chest.  He has not had any fevers, cough, or difficulty breathing.  He has not noticed any pain or swelling in his legs.  He did undergo umbilical hernia repair earlier this week, states he has been recovering well from this with no significant abdominal pain or issues with his surgical sites.     Physical Exam   Triage Vital Signs: ED Triage Vitals [06/19/23 1802]  Enc Vitals Group     BP (!) 146/110     Pulse Rate (!) 118     Resp 18     Temp 98.6 F (37 C)     Temp Source Oral     SpO2 95 %     Weight 280 lb (127 kg)     Height 5\' 8"  (1.727 m)     Head Circumference      Peak Flow      Pain Score 7     Pain Loc      Pain Edu?      Excl. in GC?     Most recent vital signs: Vitals:   06/19/23 1845 06/19/23 2000  BP: (!) 154/113 (!) 164/99  Pulse: (!) 107 (!) 102  Resp: 15 (!) 24  Temp:    SpO2: 97% 95%    Constitutional: Alert and oriented. Eyes: Conjunctivae are normal. Head: Atraumatic. Nose: No congestion/rhinnorhea. Mouth/Throat: Mucous membranes are moist.  Cardiovascular: Normal rate, regular rhythm. Grossly normal heart sounds.  2+ radial pulses bilaterally. Respiratory: Normal respiratory effort.  No retractions. Lungs CTAB. Gastrointestinal: Soft and nontender. No  distention.  Surgical sites well-healed with no erythema, warmth, or drainage. Musculoskeletal: No lower extremity tenderness nor edema.  Neurologic:  Normal speech and language. No gross focal neurologic deficits are appreciated.    ED Results / Procedures / Treatments   Labs (all labs ordered are listed, but only abnormal results are displayed) Labs Reviewed  BASIC METABOLIC PANEL - Abnormal; Notable for the following components:      Result Value   Glucose, Bld 135 (*)    Creatinine, Ser 1.37 (*)    Calcium 11.5 (*)    All other components within normal limits  CBC - Abnormal; Notable for the following components:   WBC 14.0 (*)    Hemoglobin 17.9 (*)    All other components within normal limits  D-DIMER, QUANTITATIVE - Abnormal; Notable for the following components:   D-Dimer, Quant 0.63 (*)    All other components within normal limits  TROPONIN I (HIGH SENSITIVITY) - Abnormal; Notable for the following components:   Troponin I (High Sensitivity) 2,186 (*)    All other components within normal limits  APTT  PROTIME-INR  CBC  HEPARIN LEVEL (UNFRACTIONATED)  TROPONIN I (HIGH SENSITIVITY)  EKG  ED ECG REPORT I, Chesley Noon, the attending physician, personally viewed and interpreted this ECG.   Date: 06/19/2023  EKG Time: 18:20  Rate: 109  Rhythm: sinus tachycardia  Axis: Normal  Intervals:none  ST&T Change: Inferior Q waves noted, no prior for comparison  RADIOLOGY CTA chest reviewed and interpreted by me with no pulmonary embolism.  PROCEDURES:  Critical Care performed: Yes, see critical care procedure note(s)  .Critical Care  Performed by: Chesley Noon, MD Authorized by: Chesley Noon, MD   Critical care provider statement:    Critical care time (minutes):  30   Critical care time was exclusive of:  Separately billable procedures and treating other patients and teaching time   Critical care was necessary to treat or prevent imminent or  life-threatening deterioration of the following conditions:  Cardiac failure   Critical care was time spent personally by me on the following activities:  Development of treatment plan with patient or surrogate, discussions with consultants, evaluation of patient's response to treatment, examination of patient, ordering and review of laboratory studies, ordering and review of radiographic studies, ordering and performing treatments and interventions, pulse oximetry, re-evaluation of patient's condition and review of old charts   I assumed direction of critical care for this patient from another provider in my specialty: no     Care discussed with: admitting provider      MEDICATIONS ORDERED IN ED: Medications  heparin bolus via infusion 4,000 Units (4,000 Units Intravenous Bolus from Bag 06/19/23 1936)    Followed by  heparin ADULT infusion 100 units/mL (25000 units/263mL) (1,400 Units/hr Intravenous New Bag/Given 06/19/23 1936)  aspirin chewable tablet 324 mg (324 mg Oral Given 06/19/23 1853)  iohexol (OMNIPAQUE) 350 MG/ML injection 100 mL (100 mLs Intravenous Contrast Given 06/19/23 1913)     IMPRESSION / MDM / ASSESSMENT AND PLAN / ED COURSE  I reviewed the triage vital signs and the nursing notes.                              47 y.o. male with past medical history of kidney stones and GERD who presents to the ED with pain radiating across both sides of his chest and back and into both arms starting earlier today.  Patient's presentation is most consistent with acute presentation with potential threat to life or bodily function.  Differential diagnosis includes, but is not limited to, ACS, PE, dissection, pneumonia, pneumothorax, GERD, musculoskeletal pain, anxiety, cervical radiculopathy.  Patient nontoxic-appearing and in no acute distress, vital signs remarkable for tachycardia and tachypnea but otherwise reassuring.  EKG shows sinus tachycardia with inferior Q waves, no prior for  comparison and no ST changes noted.  Symptoms seem atypical for ACS, potentially due to cervical radiculopathy but he remains neurovascular intact to bilateral upper extremities.  Chest x-ray is unremarkable, troponin markedly elevated at greater than 2000, no prior for comparison.  Patient given loading dose of aspirin and we will start on IV heparin.  CTA chest performed and negative for PE, patient continues to deny ongoing pain on reassessment.  Remainder of labs are reassuring without significant anemia, leukocytosis, tract abnormality, or AKI.  Case discussed with hospitalist for admission.      FINAL CLINICAL IMPRESSION(S) / ED DIAGNOSES   Final diagnoses:  NSTEMI (non-ST elevated myocardial infarction) (HCC)     Rx / DC Orders   ED Discharge Orders     None  Note:  This document was prepared using Dragon voice recognition software and may include unintentional dictation errors.   Chesley Noon, MD 06/19/23 2021

## 2023-06-19 NOTE — Consult Note (Signed)
ANTICOAGULATION CONSULT NOTE - Initial Consult  Pharmacy Consult for heparin infusion Indication: chest pain/ACS  No Known Allergies  Patient Measurements: Height: 5\' 8"  (172.7 cm) Weight: 127 kg (280 lb) IBW/kg (Calculated) : 68.4 Heparin Dosing Weight: 98 kg  Vital Signs: Temp: 98.6 F (37 C) (07/01 1802) Temp Source: Oral (07/01 1802) BP: 154/113 (07/01 1845) Pulse Rate: 107 (07/01 1845)  Labs: Recent Labs    06/19/23 1808  HGB 17.9*  HCT 51.9  PLT 267  CREATININE 1.37*  TROPONINIHS 2,186*    Estimated Creatinine Clearance: 88.4 mL/min (A) (by C-G formula based on SCr of 1.37 mg/dL (H)).   Medical History: Past Medical History:  Diagnosis Date   Complication of anesthesia    during colonoscopy kept waking up   GERD (gastroesophageal reflux disease)    History of kidney stones    Kidney stones     Medications:  No prior anticoagulation noted  Assessment: 47 year old male with past medical history of recent umbilical hernia repair procedure 06/15/23, presented with chest pain that runs down his left arm. Troponin elevated at 2186. Pharmacy has been consulted to initiate and manage IV heparin therapy.    Goal of Therapy:  Heparin level 0.3-0.7 units/ml Monitor platelets by anticoagulation protocol: Yes   Plan:  Give 4000 units bolus x 1 Start heparin infusion at 1400 units/hr Check anti-Xa level in 6 hours and daily while on heparin Continue to monitor H&H and platelets  Sharen Hones, PharmD, BCPS Clinical Pharmacist   06/19/2023,6:59 PM

## 2023-06-19 NOTE — H&P (Addendum)
History and Physical    Patient: Maurice Ramos ZOX:[id number] DOB: 1976-08-02 DOA: 06/19/2023 DOS: the patient was seen and examined on 06/19/2023 PCP: Pcp, No  Patient coming from: Home  Chief Complaint:  Chief Complaint  Patient presents with   Chest Pain   HPI: Maurice Ramos is a 47 y.o. male with medical history significant of umbilical hernia repair RTAPP on 06/15/23.  By all accounts, patient did pretty well at the time.  Patient was found to have a incarcerated omentum in the hernia at the time.  Patient returned home, was eating drinking, moving bowels.  Patient was doing well till earlier this a.m. when he reports chest pain that was mild on and off present at rest EKG/pressure-like in the substernal area anteriorly radiating bilaterally especially left arm.  And somewhat to the back.  Patient actually recall having a similar episode of pain several months ago.  At the time patient says it was felt to be a pinched nerve.  However since the pain recurred today, patient came to the ER.  Patient has no report of shortness of breath, palpitation, loss of consciousness, fever or cough leg swelling or cramping.  ER course is notable for patient finding of troponin markedly elevated, status post aspirin, heparin.  At this time chest pain-free.  Besides feeling cold, patient has no symptoms, brother at bedside Review of Systems: As mentioned in the history of present illness. All other systems reviewed and are negative. Past Medical History:  Diagnosis Date   Complication of anesthesia    during colonoscopy kept waking up   GERD (gastroesophageal reflux disease)    History of kidney stones    Kidney stones    Past Surgical History:  Procedure Laterality Date   COLONOSCOPY     dental implants     EYE SURGERY Left    HAND SURGERY Left    INSERTION OF MESH  06/15/2023   Procedure: INSERTION OF MESH;  Surgeon: Leafy Ro, MD;  Location: [facility] ORS;  Service: General;;   UPPER GASTROINTESTINAL  ENDOSCOPY     VASECTOMY     XI ROBOTIC ASSISTED VENTRAL HERNIA N/A 06/15/2023   Procedure: XI ROBOTIC ASSISTED VENTRAL HERNIA;  Surgeon: Leafy Ro, MD;  Location: [facility] ORS;  Service: General;  Laterality: N/A;   Social History:  reports that he has been smoking cigarettes. He has never used smokeless tobacco. He reports that he does not drink alcohol and does not use drugs.  No Known Allergies  History reviewed. No pertinent family history.  Prior to Admission medications   Medication Sig Start Date End Date Taking? Authorizing Provider  famotidine (PEPCID) 20 MG tablet Take 20 mg by mouth as needed for heartburn or indigestion.    [provider]  HYDROcodone-acetaminophen (NORCO/VICODIN) 5-325 MG tablet Take 1-2 tablets by mouth every 4 (four) hours as needed for moderate pain. 06/15/23   Leafy Ro, MD    Physical Exam: Vitals:   06/19/23 1802 06/19/23 1845 06/19/23 2000 06/19/23 2100  BP: (!) 146/110 (!) 154/113 (!) 164/99 (!) 147/99  Pulse: (!) 118 (!) 107 (!) 102 99  Resp: 18 15 (!) 24 19  Temp: 98.6 F (37 C)     TempSrc: Oral     SpO2: 95% 97% 95% 95%  Weight: 127 kg     Height: 5\' 8"  (1.727 m)      General: Obese gentleman no distress Respiratory: Bilateral intravesicular Cardiovascular exam S1-S2 normal Abdomen soft nontender Extremities warm without  edema Alert awake oriented x 3 Data Reviewed:  Labs on Admission:  Results for orders placed or performed during the hospital encounter of 06/19/23 (from the past 24 hour(s))  Basic metabolic panel     Status: Abnormal   Collection Time: 06/19/23  6:08 PM  Result Value Ref Range   Sodium 138 135 - 145 mmol/L   Potassium 4.2 3.5 - 5.1 mmol/L   Chloride 99 98 - 111 mmol/L   CO2 27 22 - 32 mmol/L   Glucose, Bld 135 (H) 70 - 99 mg/dL   BUN 16 6 - 20 mg/dL   Creatinine, Ser 5.40 (H) 0.61 - 1.24 mg/dL   Calcium 98.1 (H) 8.9 - 10.3 mg/dL   GFR, Estimated >19 >14 mL/min   Anion gap 12 5 - 15  CBC      Status: Abnormal   Collection Time: 06/19/23  6:08 PM  Result Value Ref Range   WBC 14.0 (H) 4.0 - 10.5 K/uL   RBC 5.47 4.22 - 5.81 MIL/uL   Hemoglobin 17.9 (H) 13.0 - 17.0 g/dL   HCT 78.2 95.6 - 21.3 %   MCV 94.9 80.0 - 100.0 fL   MCH 32.7 26.0 - 34.0 pg   MCHC 34.5 30.0 - 36.0 g/dL   RDW 08.6 57.8 - 46.9 %   Platelets 267 150 - 400 K/uL   nRBC 0.0 0.0 - 0.2 %  Troponin I (High Sensitivity)     Status: Abnormal   Collection Time: 06/19/23  6:08 PM  Result Value Ref Range   Troponin I (High Sensitivity) 2,186 (HH) <18 ng/L  D-dimer, quantitative     Status: Abnormal   Collection Time: 06/19/23  6:08 PM  Result Value Ref Range   D-Dimer, Quant 0.63 (H) 0.00 - 0.50 ug/mL-FEU  APTT     Status: None   Collection Time: 06/19/23  7:03 PM  Result Value Ref Range   aPTT 31 24 - 36 seconds  Protime-INR     Status: None   Collection Time: 06/19/23  7:03 PM  Result Value Ref Range   Prothrombin Time 14.0 11.4 - 15.2 seconds   INR 1.1 0.8 - 1.2  Troponin I (High Sensitivity)     Status: Abnormal   Collection Time: 06/19/23  8:07 PM  Result Value Ref Range   Troponin I (High Sensitivity) 3,765 (HH) <18 ng/L   Basic Metabolic Panel: Recent Labs  Lab 06/19/23 1808  NA 138  K 4.2  CL 99  CO2 27  GLUCOSE 135*  BUN 16  CREATININE 1.37*  CALCIUM 11.5*   Liver Function Tests: No results for input(s): "AST", "ALT", "ALKPHOS", "BILITOT", "PROT", "ALBUMIN" in the last 168 hours. No results for input(s): "LIPASE", "AMYLASE" in the last 168 hours. No results for input(s): "AMMONIA" in the last 168 hours. CBC: Recent Labs  Lab 06/19/23 1808  WBC 14.0*  HGB 17.9*  HCT 51.9  MCV 94.9  PLT 267   Cardiac Enzymes: Recent Labs  Lab 06/19/23 1808 06/19/23 2007  TROPONINIHS 2,186* 3,765*    BNP (last 3 results) No results for input(s): "PROBNP" in the last 8760 hours. CBG: No results for input(s): "GLUCAP" in the last 168 hours.  Radiological Exams on Admission:  CT Angio  Chest PE W/Cm &/Or Wo Cm  Result Date: 06/19/2023 CLINICAL DATA:  Chest pain.  Pulmonary embolism. EXAM: CT ANGIOGRAPHY CHEST WITH CONTRAST TECHNIQUE: Multidetector CT imaging of the chest was performed using the standard protocol during bolus administration of intravenous  contrast. Multiplanar CT image reconstructions and MIPs were obtained to evaluate the vascular anatomy. RADIATION DOSE REDUCTION: This exam was performed according to the departmental dose-optimization program which includes automated exposure control, adjustment of the mA and/or kV according to patient size and/or use of iterative reconstruction technique. CONTRAST:  OMNIPAQUE IOHEXOL 350 MG/ML SOLN COMPARISON:  None Available. FINDINGS: Cardiovascular: Adequate opacification of the pulmonary arterial tree. No intraluminal filling defect identified to suggest acute pulmonary embolism. Central pulmonary arteries are of normal caliber. No significant coronary artery calcification. Cardiac size within normal limits. No pericardial effusion. No significant atherosclerotic calcification within the thoracic aorta. No aortic aneurysm. Mediastinum/Nodes: No enlarged mediastinal, hilar, or axillary lymph nodes. Thyroid gland, trachea, and esophagus demonstrate no significant findings. Lungs/Pleura: Lungs are clear. No pneumothorax or pleural effusion. No central obstructing lesion. Upper Abdomen: 2.8 cm benign adrenal adenoma within the right adrenal gland noted for which no follow-up imaging is recommended. No acute abnormality within the visualized upper abdomen. Musculoskeletal: No chest wall abnormality. No acute or significant osseous findings. Review of the MIP images confirms the above findings. IMPRESSION: 1. No pulmonary embolism. No acute intrathoracic pathology identified. 2. 2.8 cm benign right adrenal adenoma. Electronically Signed   By: Helyn Numbers M.D.   On: 06/19/2023 20:06   DG Chest 2 View  Result Date: 06/19/2023 CLINICAL  DATA:  Chest pain EXAM: CHEST - 2 VIEW COMPARISON:  10/23/2004 FINDINGS: Low lung volumes. Lungs are clear. No pleural effusion or pneumothorax. The heart is normal in size. Mild degenerative changes of the visualized thoracolumbar spine. IMPRESSION: Normal chest radiographs. Electronically Signed   By: Charline Bills M.D.   On: 06/19/2023 19:17    EKG: Independently reviewed. Insignificant (sub mm) ST segment elevation concave upward in II III AVf   Assessment and Plan: * NSTEMI (non-ST elevated myocardial infarction) (HCC) Acute coronary syndrome order set applied.  Heparin infusion, aspirin statin, beta-blocker.  Use labetalol to keep SBP under 140 mmHg.  Lipid panel pending in a.m., echo pending in a.m.  Currently chest pain-free.  Erythrocytosis Check erythropoietin level, likely due to tobacco abuse.  Monitor  Leukocytosis Due to NSTEMI. No clnical concern of infection at this time, folw up UA  CKD (chronic kidney disease) Urinalysis, including protein, creatinine, kidney ureter bladder ultrasound      Advance Care Planning:   Code Status: Not on file full code  Consults: case discussed with Dr. Marca Ancona including the minimal ST elevation in infeiror leads. Notable that patient is pain free now. Therefore plan for conservative managemtn at this time. If pain recurs, repeat EKG and discuss with cath attending on call accordingly, I will  make patient NPO. He will round on the patient in AM. Kindly follow up with him in the morning thanks.  Family Communication: brother at bedside.  Severity of Illness: The appropriate patient status for this patient is INPATIENT. Inpatient status is judged to be reasonable and necessary in order to provide the required intensity of service to ensure the patient's safety. The patient's presenting symptoms, physical exam findings, and initial radiographic and laboratory data in the context of their chronic comorbidities is felt to place them at  high risk for further clinical deterioration. Furthermore, it is not anticipated that the patient will be medically stable for discharge from the hospital within 2 midnights of admission.   * I certify that at the point of admission it is my clinical judgment that the patient will require inpatient hospital care spanning  beyond 2 midnights from the point of admission due to high intensity of service, high risk for further deterioration and high frequency of surveillance required.*  Author: Nolberto Hanlon, MD 06/19/2023 9:20 PM  For on call review [email or web address].

## 2023-06-20 ENCOUNTER — Other Ambulatory Visit: Payer: Self-pay

## 2023-06-20 ENCOUNTER — Inpatient Hospital Stay (HOSPITAL_COMMUNITY)
Admit: 2023-06-20 | Discharge: 2023-06-20 | Disposition: A | Discharge status: Home / Self Care | Payer: Managed Care, Other (non HMO) | Attending: Cardiology | Admitting: Cardiology

## 2023-06-20 ENCOUNTER — Encounter
Admission: EM | Disposition: A | Discharge status: Home / Self Care | Payer: Self-pay | Source: Home / Self Care | Attending: Internal Medicine

## 2023-06-20 ENCOUNTER — Inpatient Hospital Stay: Payer: Managed Care, Other (non HMO)

## 2023-06-20 ENCOUNTER — Encounter: Payer: Self-pay | Admitting: Internal Medicine

## 2023-06-20 DIAGNOSIS — N179 Acute kidney failure, unspecified: Secondary | ICD-10-CM | POA: Diagnosis not present

## 2023-06-20 DIAGNOSIS — I214 Non-ST elevation (NSTEMI) myocardial infarction: Secondary | ICD-10-CM

## 2023-06-20 DIAGNOSIS — I2510 Atherosclerotic heart disease of native coronary artery without angina pectoris: Secondary | ICD-10-CM

## 2023-06-20 DIAGNOSIS — R030 Elevated blood-pressure reading, without diagnosis of hypertension: Secondary | ICD-10-CM | POA: Diagnosis not present

## 2023-06-20 DIAGNOSIS — Z720 Tobacco use: Secondary | ICD-10-CM | POA: Diagnosis not present

## 2023-06-20 HISTORY — PX: LEFT HEART CATH AND CORONARY ANGIOGRAPHY: CATH118249

## 2023-06-20 HISTORY — PX: CORONARY STENT INTERVENTION: CATH118234

## 2023-06-20 LAB — POCT ACTIVATED CLOTTING TIME
Activated Clotting Time: 287 seconds
Activated Clotting Time: 348 seconds
Activated Clotting Time: 324 seconds

## 2023-06-20 LAB — CBC
HCT: 48.3 % (ref 39.0–52.0)
HCT: 46.4 % (ref 39.0–52.0)
Hemoglobin: 16.4 g/dL (ref 13.0–17.0)
Hemoglobin: 15.7 g/dL (ref 13.0–17.0)
MCH: 32.6 pg (ref 26.0–34.0)
MCH: 32.6 pg (ref 26.0–34.0)
MCHC: 34 g/dL (ref 30.0–36.0)
MCHC: 33.8 g/dL (ref 30.0–36.0)
MCV: 96 fL (ref 80.0–100.0)
MCV: 96.3 fL (ref 80.0–100.0)
Platelets: 244 10*3/uL (ref 150–400)
Platelets: 209 10*3/uL (ref 150–400)
RBC: 5.03 MIL/uL (ref 4.22–5.81)
RBC: 4.82 MIL/uL (ref 4.22–5.81)
RDW: 12.7 % (ref 11.5–15.5)
RDW: 12.9 % (ref 11.5–15.5)
WBC: 15 10*3/uL — ABNORMAL HIGH (ref 4.0–10.5)
WBC: 10.9 10*3/uL — ABNORMAL HIGH (ref 4.0–10.5)
nRBC: 0 % (ref 0.0–0.2)
nRBC: 0 % (ref 0.0–0.2)

## 2023-06-20 LAB — URINALYSIS, COMPLETE (UACMP) WITH MICROSCOPIC
Bacteria, UA: NONE SEEN
Bilirubin Urine: NEGATIVE
Glucose, UA: NEGATIVE mg/dL
Hgb urine dipstick: NEGATIVE
Ketones, ur: NEGATIVE mg/dL
Leukocytes,Ua: NEGATIVE
Nitrite: NEGATIVE
Protein, ur: NEGATIVE mg/dL
Specific Gravity, Urine: 1.028 (ref 1.005–1.030)
Squamous Epithelial / HPF: NONE SEEN /HPF (ref 0–5)
pH: 6 (ref 5.0–8.0)

## 2023-06-20 LAB — HEPATIC FUNCTION PANEL
ALT: 53 U/L — ABNORMAL HIGH (ref 0–44)
AST: 60 U/L — ABNORMAL HIGH (ref 15–41)
Albumin: 4 g/dL (ref 3.5–5.0)
Alkaline Phosphatase: 118 U/L (ref 38–126)
Bilirubin, Direct: <0.1 mg/dL (ref 0.0–0.2)
Total Bilirubin: 0.6 mg/dL (ref 0.3–1.2)
Total Protein: 7.2 g/dL (ref 6.5–8.1)

## 2023-06-20 LAB — GLUCOSE, CAPILLARY: Glucose-Capillary: 150 mg/dL — ABNORMAL HIGH (ref 70–99)

## 2023-06-20 LAB — LIPID PANEL
Cholesterol: 236 mg/dL — ABNORMAL HIGH (ref 0–200)
HDL: 37 mg/dL — ABNORMAL LOW (ref >40)
LDL Cholesterol: 126 mg/dL — ABNORMAL HIGH (ref 0–99)
Total CHOL/HDL Ratio: 6.4 RATIO
Triglycerides: 365 mg/dL — ABNORMAL HIGH (ref <150)
VLDL: 73 mg/dL — ABNORMAL HIGH (ref 0–40)

## 2023-06-20 LAB — ECHOCARDIOGRAM COMPLETE
AR max vel: 2.52 cm2
AV Area VTI: 2.77 cm2
AV Area mean vel: 2.21 cm2
AV Mean grad: 5 mmHg
AV Peak grad: 9.5 mmHg
Ao pk vel: 1.54 m/s
Area-P 1/2: 3.37 cm2
Height: 68 in
MV VTI: 2.13 cm2
S' Lateral: 3.5 cm
Weight: 4480 oz

## 2023-06-20 LAB — CBG MONITORING, ED: Glucose-Capillary: 134 mg/dL — ABNORMAL HIGH (ref 70–99)

## 2023-06-20 LAB — CREATININE, URINE, RANDOM: Creatinine, Urine: 94 mg/dL

## 2023-06-20 LAB — TROPONIN I (HIGH SENSITIVITY): Troponin I (High Sensitivity): 8196 ng/L (ref <18)

## 2023-06-20 LAB — SODIUM, URINE, RANDOM: Sodium, Ur: 74 mmol/L

## 2023-06-20 LAB — HIV ANTIBODY (ROUTINE TESTING W REFLEX): HIV Screen 4th Generation wRfx: NONREACTIVE

## 2023-06-20 LAB — HEPARIN LEVEL (UNFRACTIONATED)
Heparin Unfractionated: 0.25 IU/mL — ABNORMAL LOW (ref 0.30–0.70)
Heparin Unfractionated: 0.26 IU/mL — ABNORMAL LOW (ref 0.30–0.70)

## 2023-06-20 SURGERY — LEFT HEART CATH AND CORONARY ANGIOGRAPHY
Anesthesia: Moderate Sedation

## 2023-06-20 MED ORDER — FENTANYL CITRATE (PF) 100 MCG/2ML IJ SOLN
INTRAMUSCULAR | Status: DC | PRN
  Administered 2023-06-20 (×3): 25 ug via INTRAVENOUS
  Administered 2023-06-20: 50 ug via INTRAVENOUS

## 2023-06-20 MED ORDER — NITROGLYCERIN 0.4 MG SL SUBL
SUBLINGUAL_TABLET | SUBLINGUAL | Status: AC
  Filled 2023-06-20: qty 2

## 2023-06-20 MED ORDER — HYDRALAZINE HCL 20 MG/ML IJ SOLN: 10.0000 mg | INTRAMUSCULAR | Status: AC | PRN

## 2023-06-20 MED ORDER — MIDAZOLAM HCL 2 MG/2ML IJ SOLN
INTRAMUSCULAR | Status: DC | PRN
  Administered 2023-06-20 (×2): 1 mg via INTRAVENOUS

## 2023-06-20 MED ORDER — HEPARIN (PORCINE) IN NACL 1000-0.9 UT/500ML-% IV SOLN
INTRAVENOUS | Status: AC
  Filled 2023-06-20: qty 1000

## 2023-06-20 MED ORDER — HEPARIN SODIUM (PORCINE) 1000 UNIT/ML IJ SOLN
INTRAMUSCULAR | Status: AC
  Filled 2023-06-20: qty 10

## 2023-06-20 MED ORDER — SODIUM CHLORIDE 0.9% FLUSH: 3.0000 mL | INTRAVENOUS | Status: DC | PRN

## 2023-06-20 MED ORDER — TICAGRELOR 90 MG PO TABS
ORAL_TABLET | ORAL | Status: AC
  Filled 2023-06-20: qty 2

## 2023-06-20 MED ORDER — HEPARIN SODIUM (PORCINE) 1000 UNIT/ML IJ SOLN
INTRAMUSCULAR | Status: DC | PRN
  Administered 2023-06-20: 5000 [IU] via INTRAVENOUS
  Administered 2023-06-20: 4000 [IU] via INTRAVENOUS
  Administered 2023-06-20: 8000 [IU] via INTRAVENOUS

## 2023-06-20 MED ORDER — TICAGRELOR 90 MG PO TABS
90.0000 mg | ORAL_TABLET | Freq: Two times a day (BID) | ORAL | Status: DC
  Administered 2023-06-21: 90 mg via ORAL
  Filled 2023-06-20: qty 1

## 2023-06-20 MED ORDER — LABETALOL HCL 5 MG/ML IV SOLN: 10.0000 mg | INTRAVENOUS | Status: AC | PRN

## 2023-06-20 MED ORDER — HEPARIN (PORCINE) IN NACL 2000-0.9 UNIT/L-% IV SOLN
INTRAVENOUS | Status: DC | PRN
  Administered 2023-06-20: 1000 mL

## 2023-06-20 MED ORDER — LIDOCAINE HCL (PF) 1 % IJ SOLN
INTRAMUSCULAR | Status: DC | PRN
  Administered 2023-06-20: 5 mL

## 2023-06-20 MED ORDER — HEPARIN BOLUS VIA INFUSION
2000.0000 [IU] | Freq: Once | INTRAVENOUS | Status: AC
  Administered 2023-06-20: 2000 [IU] via INTRAVENOUS
  Filled 2023-06-20: qty 2000

## 2023-06-20 MED ORDER — SODIUM CHLORIDE 0.9 % IV SOLN: INTRAVENOUS | Status: AC

## 2023-06-20 MED ORDER — SODIUM CHLORIDE 0.9 % IV SOLN: 250.0000 mL | INTRAVENOUS | Status: DC | PRN

## 2023-06-20 MED ORDER — VERAPAMIL HCL 2.5 MG/ML IV SOLN
INTRAVENOUS | Status: DC | PRN
  Administered 2023-06-20 (×2): 2.5 mg via INTRAVENOUS

## 2023-06-20 MED ORDER — MIDAZOLAM HCL 2 MG/2ML IJ SOLN
INTRAMUSCULAR | Status: AC
  Filled 2023-06-20: qty 2

## 2023-06-20 MED ORDER — SODIUM CHLORIDE 0.9 % IV SOLN: INTRAVENOUS | Status: DC

## 2023-06-20 MED ORDER — HEPARIN BOLUS VIA INFUSION
1450.0000 [IU] | Freq: Once | INTRAVENOUS | Status: AC
  Administered 2023-06-20: 1450 [IU] via INTRAVENOUS
  Filled 2023-06-20: qty 1450

## 2023-06-20 MED ORDER — IOHEXOL 300 MG/ML  SOLN
INTRAMUSCULAR | Status: DC | PRN
  Administered 2023-06-20: 140 mL

## 2023-06-20 MED ORDER — NITROGLYCERIN 1 MG/10 ML FOR IR/CATH LAB
INTRA_ARTERIAL | Status: AC
  Filled 2023-06-20: qty 10

## 2023-06-20 MED ORDER — OXYCODONE-ACETAMINOPHEN 5-325 MG PO TABS
ORAL_TABLET | ORAL | Status: AC
  Filled 2023-06-20: qty 2

## 2023-06-20 MED ORDER — ENOXAPARIN SODIUM 80 MG/0.8ML IJ SOSY
65.0000 mg | PREFILLED_SYRINGE | INTRAMUSCULAR | Status: DC
  Administered 2023-06-21: 65 mg via SUBCUTANEOUS
  Filled 2023-06-20: qty 0.8

## 2023-06-20 MED ORDER — FENTANYL CITRATE (PF) 100 MCG/2ML IJ SOLN
INTRAMUSCULAR | Status: AC
  Filled 2023-06-20: qty 2

## 2023-06-20 MED ORDER — SODIUM CHLORIDE 0.9% FLUSH
3.0000 mL | Freq: Two times a day (BID) | INTRAVENOUS | Status: DC
  Administered 2023-06-20: 3 mL via INTRAVENOUS

## 2023-06-20 MED ORDER — LIDOCAINE HCL 1 % IJ SOLN
INTRAMUSCULAR | Status: AC
  Filled 2023-06-20: qty 20

## 2023-06-20 MED ORDER — VERAPAMIL HCL 2.5 MG/ML IV SOLN
INTRAVENOUS | Status: AC
  Filled 2023-06-20: qty 2

## 2023-06-20 MED ORDER — HEPARIN (PORCINE) IN NACL 1000-0.9 UT/500ML-% IV SOLN
INTRAVENOUS | Status: DC | PRN
  Administered 2023-06-20: 500 mL

## 2023-06-20 MED ORDER — TICAGRELOR 90 MG PO TABS
ORAL_TABLET | ORAL | Status: DC | PRN
  Administered 2023-06-20: 180 mg via ORAL

## 2023-06-20 MED ORDER — HYDROCODONE-ACETAMINOPHEN 5-325 MG PO TABS
ORAL_TABLET | ORAL | Status: AC
  Filled 2023-06-20: qty 2

## 2023-06-20 MED ORDER — NITROGLYCERIN 1 MG/10 ML FOR IR/CATH LAB
INTRA_ARTERIAL | Status: DC | PRN
  Administered 2023-06-20: 100 ug
  Administered 2023-06-20: 200 ug

## 2023-06-20 MED ORDER — ASPIRIN 81 MG PO CHEW: 81.0000 mg | CHEWABLE_TABLET | ORAL | Status: DC

## 2023-06-20 MED ORDER — SODIUM CHLORIDE 0.9% FLUSH
3.0000 mL | Freq: Two times a day (BID) | INTRAVENOUS | Status: DC
  Administered 2023-06-20 (×2): 3 mL via INTRAVENOUS

## 2023-06-20 SURGICAL SUPPLY — 22 items
BALLN ~~LOC~~ TREK NEO RX 3.0X8 (BALLOONS) IMPLANT
CATH 5F 110X4 TIG (CATHETERS) IMPLANT
CATH DRAGONFLY OPSTAR (CATHETERS) IMPLANT
CATH INFINITI 5 FR 3DRC (CATHETERS) IMPLANT
CATH INFINITI 5 FR MPA2 (CATHETERS) IMPLANT
CATH INFINITI 5FR AL1 (CATHETERS) IMPLANT
CATH INFINITI JR4 5F (CATHETERS) IMPLANT
CATH LAUNCHER 6FR EBU3.5 (CATHETERS) IMPLANT
DEVICE RAD COMP TR BAND LRG (VASCULAR PRODUCTS) IMPLANT
DEVICE RAD TR BAND REGULAR (VASCULAR PRODUCTS) IMPLANT
DRAPE BRACHIAL (DRAPES) IMPLANT
GLIDESHEATH SLEND SS 6F .021 (SHEATH) IMPLANT
GUIDEWIRE INQWIRE 1.5J.035X260 (WIRE) IMPLANT
INQWIRE 1.5J .035X260CM (WIRE) ×1
KIT ENCORE 26 ADVANTAGE (KITS) IMPLANT
PACK CARDIAC CATH (CUSTOM PROCEDURE TRAY) ×1 IMPLANT
PROTECTION STATION PRESSURIZED (MISCELLANEOUS) ×1
SET ATX-X65L (MISCELLANEOUS) IMPLANT
SHEATH 6FR 85 DEST SLENDER (SHEATH) IMPLANT
STATION PROTECTION PRESSURIZED (MISCELLANEOUS) IMPLANT
STENT ONYX FRONTIER 2.5X12 (Permanent Stent) IMPLANT
WIRE RUNTHROUGH .014X180CM (WIRE) IMPLANT

## 2023-06-20 NOTE — ED Notes (Signed)
Report from grace, rn.  

## 2023-06-20 NOTE — Interval H&P Note (Signed)
History and Physical Interval Note:  06/20/2023 3:16 PM  Maurice Ramos  has presented today for surgery, with the diagnosis of non st elevation myocardial infarction.  The various methods of treatment have been discussed with the patient and family. After consideration of risks, benefits and other options for treatment, the patient has consented to  Procedure(s): LEFT HEART CATH AND CORONARY ANGIOGRAPHY (N/A) as a surgical intervention.  The patient's history has been reviewed, patient examined, no change in status, stable for surgery.  I have reviewed the patient's chart and labs.  Questions were answered to the patient's satisfaction.    Cath Lab Visit (complete for each Cath Lab visit)  Clinical Evaluation Leading to the Procedure:   ACS: Yes.    Non-ACS:  N/A  [name]

## 2023-06-20 NOTE — Progress Notes (Signed)
Progress Note   Patient: Maurice Ramos ZOX:[id number] DOB: [birth year] DOA: 06/19/2023     1 DOS: the patient was seen and examined on 06/20/2023    Subjective:  Patient seen and examined at bedside this morning Cardiologist planning on cardiac catheterization today I discussed this extensively with patient at bedside in the presence of his friend Denies ongoing chest pains, cough nausea vomiting or abdominal pain   Brief hospital course: From HPI: Maurice Ramos is a 47 y.o. male with medical history significant of umbilical hernia repair RTAPP on 06/15/23.  By all accounts, patient did pretty well at the time.  Patient was found to have a incarcerated omentum in the hernia at the time.  Patient returned home, was eating drinking, moving bowels.  Patient was doing well till earlier this a.m. when he reports chest pain that was mild on and off present at rest EKG/pressure-like in the substernal area anteriorly radiating bilaterally especially left arm.  And somewhat to the back.  Patient actually recall having a similar episode of pain several months ago.  At the time patient says it was felt to be a pinched nerve.  However since the pain recurred today, patient came to the ER.  Patient has no report of shortness of breath, palpitation, loss of consciousness, fever or cough leg swelling or cramping.   ER course is notable for patient finding of troponin markedly elevated, status post aspirin, heparin.  At this time chest pain-free.  Besides feeling cold, patient has no symptoms, brother at bedside"   Assessment and Plan   NSTEMI (non-ST elevated myocardial infarction) (HCC) Acute coronary syndrome order set applied.  Heparin infusion, aspirin statin, beta-blocker.  Use labetalol to keep SBP under 140 mmHg.  Lipid panel pending in a.m., echo pending in a.m.  Currently chest pain-free. I personally discussed the case with cardiologist Dr. Okey Dupre Cardiology plans to call the patient later today We will  continue on ACS protocol including heparin drip, aspirin, statin, beta-blocker therapy Continue to monitor on telemetry closely Patient continues to remain chest pain-free Troponin was significantly elevated 2186 then 3765 then 8196 Appreciate the input of cardiology  Erythrocytosis Follow-up on erythropoietin level   Leukocytosis likely reactive Continue to monitor closely  CKD (chronic kidney disease) Renal function currently at baseline We will continue to monitor renal function    Advance Care Planning:   Code Status: Not on file full code   Consults: Cardiology   Family Communication: Discussed with patient's friend present at bedside  Physical Exam: General: Middle-aged male obese not in acute distress Respiratory: Normal breath sounds Cardiovascular exam: Heart sounds 1 and 2 normal Abdomen: Soft close obese Extremities: Normal pulses.  No edema CNS patient has awake and alert x 3   Data Reviewed: I have personally reviewed patient's CT scan of the chest which did not show PE, I have personally reviewed patient's chest x-ray consult that did not show any acute pulmonary pathology, I have reviewed patient's laboratory results as well as nurses notes and patient's vitals   Vitals:   06/20/23 1732 06/20/23 1757 06/20/23 1802 06/20/23 1817  BP: (!) 143/104 (!) 128/98 129/79 136/77  Pulse: 78 68 71 75  Resp: 18 16 15 16   Temp:      TempSrc:      SpO2: 95%  92% 95%  Weight:      Height:          Author: Loyce Dys, MD 06/20/2023 6:38 PM  For on call review  [email or web address]

## 2023-06-20 NOTE — ED Notes (Signed)
Pt placed on oxygen at 2lpm while sleeping for pox of 90%, rebound pox to 94% after oxygen applied.

## 2023-06-20 NOTE — H&P (View-Only) (Signed)
 Cardiology Consultation   Patient ID: Maurice Ramos MRN: [id number]; DOB: 12/14/1976  Admit date: 06/19/2023 Date of Consult: 06/20/2023  PCP:  Pcp, No   [facility] Providers Cardiologist:  None      New consult completed by Dr End  Patient Profile:   Maurice Ramos is a 46 y.o. male with a hx of umbilical hernia s/p repair, GERD, kidney stones, current smoker,  who is being seen 06/20/2023 for the evaluation of chest pain and elevated high sensitivity troponins concerning for NSTEMI at the request of Dr Djan.  History of Present Illness:   [name] underwent repair of an umbilical hernia on Thursday of last week.  Has been at home doing well eating, having bowel movements, without any complications from surgery.  He presented to the emergency department on 06/19/2023 with complaints of atypical chest pain that radiated to his bilateral arms.  He had stated that he had had chest pain and discomfort into his left lower arm that went up the arm across his chest and down his right arm that lasted for approximately 10 minutes.  He described the pain as a sharp pain that felt similar to pain that he had experienced in the past and attributed to nerve irritation that he was previously treated with prednisone for.  He denied any other associated symptoms of shortness of breath, nausea, vomiting, or diaphoresis.  He recently underwent umbilical hernia repair that was uncomplicated and he was discharged home without incident.  He is eager to return home and has been advised it would likely be tomorrow before he would be able to be discharged pending his further cardiac workup.  Initial vitals: Blood pressure 146/110, pulse of 118, respirations of 18, temperature of 98.6  Pertinent labs: Blood glucose 135, serum creatinine 1.37, calcium 11.5, WBCs 14.0, hemoglobin 17.9, D-dimer 0.63, troponin 2186, 3765, 8196  Imaging: CXR normal chest radiographs; CTA of the chest negative for pulmonary  embolism  Medications administered in the emergency department: Heparin bolus, heparin infusion, aspirin 324 mg  Cardiology was consulted to evaluate patient for concerns of chest pain.   Past Medical History:  Diagnosis Date   Complication of anesthesia    during colonoscopy kept waking up   GERD (gastroesophageal reflux disease)    History of kidney stones    Kidney stones     Past Surgical History:  Procedure Laterality Date   COLONOSCOPY     dental implants     EYE SURGERY Left    HAND SURGERY Left    INSERTION OF MESH  06/15/2023   Procedure: INSERTION OF MESH;  Surgeon: Pabon, Diego F, MD;  Location: [facility] ORS;  Service: General;;   UPPER GASTROINTESTINAL ENDOSCOPY     VASECTOMY     XI ROBOTIC ASSISTED VENTRAL HERNIA N/A 06/15/2023   Procedure: XI ROBOTIC ASSISTED VENTRAL HERNIA;  Surgeon: Pabon, Diego F, MD;  Location: [facility] ORS;  Service: General;  Laterality: N/A;     Home Medications:  Prior to Admission medications   Medication Sig Start Date End Date Taking? Authorizing Provider  famotidine (PEPCID) 20 MG tablet Take 20 mg by mouth as needed for heartburn or indigestion.   Yes [provider]  HYDROcodone-acetaminophen (NORCO/VICODIN) 5-325 MG tablet Take 1-2 tablets by mouth every 4 (four) hours as needed for moderate pain. 06/15/23  Yes Pabon, Diego F, MD    Inpatient Medications: Scheduled Meds:  aspirin EC  81 mg Oral Daily   atorvastatin  40 mg Oral   Daily   metoprolol tartrate  12.5 mg Oral BID   Continuous Infusions:  heparin 1,600 Units/hr (06/20/23 0240)   lactated ringers 100 mL/hr at 06/19/23 2230   PRN Meds: acetaminophen, famotidine, HYDROcodone-acetaminophen, labetalol, nicotine polacrilex, nitroGLYCERIN, ondansetron (ZOFRAN) IV  Allergies:   No Known Allergies  Social History:   Social History   Socioeconomic History   Marital status: Single    Spouse name: Not on file   Number of children: Not on file   Years of education: Not  on file   Highest education level: Not on file  Occupational History   Not on file  Tobacco Use   Smoking status: Every Day    Types: Cigarettes   Smokeless tobacco: Never  Vaping Use   Vaping Use: Never used  Substance and Sexual Activity   Alcohol use: No   Drug use: Never   Sexual activity: Not on file  Other Topics Concern   Not on file  Social History Narrative   Lives alone, has roommate   Social Determinants of Health   Financial Resource Strain: Not on file  Food Insecurity: Not on file  Transportation Needs: Not on file  Physical Activity: Not on file  Stress: Not on file  Social Connections: Not on file  Intimate Partner Violence: Not on file    Family History:   History reviewed. No pertinent family history.  Patient does not have any knowledge of his family history.  ROS:  Please see the history of present illness.  Review of Systems  Cardiovascular:  Positive for chest pain.  Neurological:  Positive for tingling.    All other ROS reviewed and negative.     Physical Exam/Data:   Vitals:   06/20/23 0255 06/20/23 0330 06/20/23 0500 06/20/23 0531  BP: (!) 138/104 (!) 140/101 (!) 140/101 136/89  Pulse: 81 68 73   Resp: 18 (!) 21 18   Temp:      TempSrc:      SpO2: 92% 97% 97%   Weight:      Height:       No intake or output data in the 24 hours ending 06/20/23 0738    06/19/2023    6:02 PM 06/15/2023    6:22 AM 06/05/2023    1:22 PM  Last 3 Weights  Weight (lbs) 280 lb 272 lb 12.8 oz 272 lb 12.8 oz  Weight (kg) 127.007 kg 123.741 kg 123.741 kg     Body mass index is 42.57 kg/m.  General:  Well nourished, well developed, in no acute distress HEENT: normal Neck: no JVD Vascular: No carotid bruits; Distal pulses 2+ bilaterally Cardiac:  normal S1, S2; RRR; no murmur  Lungs:  clear to auscultation bilaterally, no wheezing, rhonchi or rales  Abd: soft, mildly tender, no hepatomegaly  Ext: trace edema Musculoskeletal:  No deformities, BUE and  BLE strength normal and equal Skin: warm and dry, stables intact to the umbilical area along with Dermabond noted on the left side of the abd in two separate areas Neuro:  CNs 2-12 intact, no focal abnormalities noted Psych:  Normal affect   EKG:  The EKG was personally reviewed and demonstrates: Sinus tachycardia with rate 109, diffuse ST elevation with subtle PR depression in inferior leads with no acute reciprocal changes Telemetry:  Telemetry was personally reviewed and demonstrates:  sinus with mild changes to ST segments  Relevant CV Studies:  Echocardiogram ordered and pending  Laboratory Data:  High Sensitivity Troponin:   Recent Labs    Lab 06/19/23 1808 06/19/23 2007  TROPONINIHS 2,186* 3,765*     Chemistry Recent Labs  Lab 06/19/23 1808  NA 138  K 4.2  CL 99  CO2 27  GLUCOSE 135*  BUN 16  CREATININE 1.37*  CALCIUM 11.5*  GFRNONAA >60  ANIONGAP 12    Recent Labs  Lab 06/20/23 0145  PROT 7.2  ALBUMIN 4.0  AST 60*  ALT 53*  ALKPHOS 118  BILITOT 0.6   Lipids  Recent Labs  Lab 06/20/23 0145  CHOL 236*  TRIG 365*  HDL 37*  LDLCALC 126*  CHOLHDL 6.4    Hematology Recent Labs  Lab 06/19/23 1808 06/20/23 0145  WBC 14.0* 15.0*  RBC 5.47 5.03  HGB 17.9* 16.4  HCT 51.9 48.3  MCV 94.9 96.0  MCH 32.7 32.6  MCHC 34.5 34.0  RDW 12.8 12.7  PLT 267 244   Thyroid No results for input(s): "TSH", "FREET4" in the last 168 hours.  BNPNo results for input(s): "BNP", "PROBNP" in the last 168 hours.  DDimer  Recent Labs  Lab 06/19/23 1808  DDIMER 0.63*     Radiology/Studies:  US RENAL  Result Date: 06/20/2023 CLINICAL DATA:  Chronic kidney disease EXAM: RENAL / URINARY TRACT ULTRASOUND COMPLETE COMPARISON:  10/18/2016 CT abdomen pelvis FINDINGS: Right Kidney: Renal measurements: 11.5 x 4.6 x 4.4 cm = volume: 122 mL. Echogenicity within normal limits. No mass or hydronephrosis visualized. Left Kidney: Renal measurements: 12.6 x 6.2 x 5.3 cm = volume:  217 mL. Echogenicity within normal limits. No mass or hydronephrosis visualized. Bladder: Appears normal for degree of bladder distention. Other: Incidental note of right adrenal adenoma, as seen on 10/18/2016 IMPRESSION: 1. Normal renal ultrasound. 2. Incidental note of right adrenal adenoma. Electronically Signed   By: Kevin  Herman M.D.   On: 06/20/2023 02:56   CT Angio Chest PE W/Cm &/Or Wo Cm  Result Date: 06/19/2023 CLINICAL DATA:  Chest pain.  Pulmonary embolism. EXAM: CT ANGIOGRAPHY CHEST WITH CONTRAST TECHNIQUE: Multidetector CT imaging of the chest was performed using the standard protocol during bolus administration of intravenous contrast. Multiplanar CT image reconstructions and MIPs were obtained to evaluate the vascular anatomy. RADIATION DOSE REDUCTION: This exam was performed according to the departmental dose-optimization program which includes automated exposure control, adjustment of the mA and/or kV according to patient size and/or use of iterative reconstruction technique. CONTRAST:  100mL OMNIPAQUE IOHEXOL 350 MG/ML SOLN COMPARISON:  None Available. FINDINGS: Cardiovascular: Adequate opacification of the pulmonary arterial tree. No intraluminal filling defect identified to suggest acute pulmonary embolism. Central pulmonary arteries are of normal caliber. No significant coronary artery calcification. Cardiac size within normal limits. No pericardial effusion. No significant atherosclerotic calcification within the thoracic aorta. No aortic aneurysm. Mediastinum/Nodes: No enlarged mediastinal, hilar, or axillary lymph nodes. Thyroid gland, trachea, and esophagus demonstrate no significant findings. Lungs/Pleura: Lungs are clear. No pneumothorax or pleural effusion. No central obstructing lesion. Upper Abdomen: 2.8 cm benign adrenal adenoma within the right adrenal gland noted for which no follow-up imaging is recommended. No acute abnormality within the visualized upper abdomen.  Musculoskeletal: No chest wall abnormality. No acute or significant osseous findings. Review of the MIP images confirms the above findings. IMPRESSION: 1. No pulmonary embolism. No acute intrathoracic pathology identified. 2. 2.8 cm benign right adrenal adenoma. Electronically Signed   By: Ashesh  Parikh M.D.   On: 06/19/2023 20:06   DG Chest 2 View  Result Date: 06/19/2023 CLINICAL DATA:  Chest pain EXAM: CHEST - 2 VIEW COMPARISON:    10/23/2004 FINDINGS: Low lung volumes. Lungs are clear. No pleural effusion or pneumothorax. The heart is normal in size. Mild degenerative changes of the visualized thoracolumbar spine. IMPRESSION: Normal chest radiographs. Electronically Signed   By: Sriyesh  Krishnan M.D.   On: 06/19/2023 19:17     Assessment and Plan:   NSTEMI -Presented to the emergency department with chest pain -High-sensitivity troponins trended 2186 and 3765 -Additional high-sensitivity troponin ordered -Continued on heparin infusion -Clear liquid diet until 10 AM then n.p.o. -Repeat EKG ordered for concern of ST changes noted on arrival EKG -Scheduled for left heart catheterization later this afternoon -Further recommendations to follow post catheterization -Echocardiogram ordered and pending with further recommendations to follow -Continue on aspirin and statin -Continue with telemetry monitoring -EKG as needed for pain or changes  Elevated blood pressures without the diagnoses of hypertension -Blood pressure 140/101 recheck 136/89 -Metoprolol titrate 12.5 mg twice daily -Vital signs per unit protocol  AKI -Serum creatinine 1.37 -Repeat BMP ordered -Continued with gentle hydration after receiving IV contrast for CTA of the chest -Monitor urine output -Monitor/trend/replete electrolytes as needed -Avoid nephrotoxic agents were able  Tobacco abuse -Total cessation is recommended  Informed Consent   Shared Decision Making/Informed Consent The risks [stroke (1 in 1000),  death (1 in 1000), kidney failure [usually temporary] (1 in 500), bleeding (1 in 200), allergic reaction [possibly serious] (1 in 200)], benefits (diagnostic support and management of coronary artery disease) and alternatives of a cardiac catheterization were discussed in detail with Mr. Myron and he is willing to proceed.      Risk Assessment/Risk Scores:     TIMI Risk Score for Unstable Angina or Non-ST Elevation MI:   The patient's TIMI risk score is  , which indicates a  % risk of all cause mortality, new or recurrent myocardial infarction or need for urgent revascularization in the next 14 days.          For questions or updates, please contact [facility] Please consult [email or web address] for contact info under    Signed, Tamie Minteer, NP  06/20/2023 7:38 AM  

## 2023-06-20 NOTE — ED Notes (Signed)
Renal ultrasound in progress.

## 2023-06-20 NOTE — ED Notes (Signed)
Report to bianca, rn.  

## 2023-06-20 NOTE — Consult Note (Signed)
ANTICOAGULATION CONSULT NOTE - Initial Consult  Pharmacy Consult for heparin infusion Indication: chest pain/ACS  No Known Allergies  Patient Measurements: Height: 5\' 8"  (172.7 cm) Weight: 127 kg (280 lb) IBW/kg (Calculated) : 68.4 Heparin Dosing Weight: 98 kg  Vital Signs: Temp: 99 F (37.2 C) (07/01 2232) Temp Source: Oral (07/01 1802) BP: 143/84 (07/02 0000) Pulse Rate: 64 (07/02 0000)  Labs: Recent Labs    06/19/23 1808 06/19/23 1903 06/19/23 2007 06/20/23 0145  HGB 17.9*  --   --  16.4  HCT 51.9  --   --  48.3  PLT 267  --   --  244  APTT  --  31  --   --   LABPROT  --  14.0  --   --   INR  --  1.1  --   --   HEPARINUNFRC  --   --   --  0.25*  CREATININE 1.37*  --   --   --   TROPONINIHS 2,186*  --  3,765*  --      Estimated Creatinine Clearance: 88.4 mL/min (A) (by C-G formula based on SCr of 1.37 mg/dL (H)).   Medical History: Past Medical History:  Diagnosis Date   Complication of anesthesia    during colonoscopy kept waking up   GERD (gastroesophageal reflux disease)    History of kidney stones    Kidney stones     Medications:  No prior anticoagulation noted  Assessment: 47 year old male with past medical history of recent umbilical hernia repair procedure 06/15/23, presented with chest pain that runs down his left arm. Troponin elevated at 2186. Pharmacy has been consulted to initiate and manage IV heparin therapy.    Goal of Therapy:  Heparin level 0.3-0.7 units/ml Monitor platelets by anticoagulation protocol: Yes   Plan:  7/2:  HL @ 0145 = 0.25, SUBtherapeutic  - Will order heparin 1450 units IV X 1 bolus and increase drip rate to 1600 units/hr. - Will recheck HL 6 hrs after rate change.   Scherrie Gerlach, PharmD Clinical Pharmacist   06/20/2023,2:35 AM

## 2023-06-20 NOTE — Consult Note (Signed)
Cardiology Consultation   Patient ID: [name] MRN: [id number]; DOB: [birth year]  Admit date: 06/19/2023 Date of Consult: 06/20/2023  PCP:  Oneita Hurt, No   [facility] Providers Cardiologist:  None      New consult completed by Dr End  Patient Profile:   Maurice Ramos is a 47 y.o. male with a hx of umbilical hernia s/p repair, GERD, kidney stones, current smoker,  who is being seen 06/20/2023 for the evaluation of chest pain and elevated high sensitivity troponins concerning for NSTEMI at the request of Dr Meriam Sprague.  History of Present Illness:   Mr. Maurice Ramos underwent repair of an umbilical hernia on Thursday of last week.  Has been at home doing well eating, having bowel movements, without any complications from surgery.  He presented to the emergency department on 06/19/2023 with complaints of atypical chest pain that radiated to his bilateral arms.  He had stated that he had had chest pain and discomfort into his left lower arm that went up the arm across his chest and down his right arm that lasted for approximately 10 minutes.  He described the pain as a sharp pain that felt similar to pain that he had experienced in the past and attributed to nerve irritation that he was previously treated with prednisone for.  He denied any other associated symptoms of shortness of breath, nausea, vomiting, or diaphoresis.  He recently underwent umbilical hernia repair that was uncomplicated and he was discharged home without incident.  He is eager to return home and has been advised it would likely be tomorrow before he would be able to be discharged pending his further cardiac workup.  Initial vitals: Blood pressure 146/110, pulse of 118, respirations of 18, temperature of 98.6  Pertinent labs: Blood glucose 135, serum creatinine 1.37, calcium 11.5, WBCs 14.0, hemoglobin 17.9, D-dimer 0.63, troponin 2186, 3765, 8196  Imaging: CXR normal chest radiographs; CTA of the chest negative for pulmonary  embolism  Medications administered in the emergency department: Heparin bolus, heparin infusion, aspirin 324 mg  Cardiology was consulted to evaluate patient for concerns of chest pain.   Past Medical History:  Diagnosis Date   Complication of anesthesia    during colonoscopy kept waking up   GERD (gastroesophageal reflux disease)    History of kidney stones    Kidney stones     Past Surgical History:  Procedure Laterality Date   COLONOSCOPY     dental implants     EYE SURGERY Left    HAND SURGERY Left    INSERTION OF MESH  06/15/2023   Procedure: INSERTION OF MESH;  Surgeon: Leafy Ro, MD;  Location: [facility] ORS;  Service: General;;   UPPER GASTROINTESTINAL ENDOSCOPY     VASECTOMY     XI ROBOTIC ASSISTED VENTRAL HERNIA N/A 06/15/2023   Procedure: XI ROBOTIC ASSISTED VENTRAL HERNIA;  Surgeon: Leafy Ro, MD;  Location: [facility] ORS;  Service: General;  Laterality: N/A;     Home Medications:  Prior to Admission medications   Medication Sig Start Date End Date Taking? Authorizing Provider  famotidine (PEPCID) 20 MG tablet Take 20 mg by mouth as needed for heartburn or indigestion.   Yes [provider]  HYDROcodone-acetaminophen (NORCO/VICODIN) 5-325 MG tablet Take 1-2 tablets by mouth every 4 (four) hours as needed for moderate pain. 06/15/23  Yes Pabon, Diego F, MD    Inpatient Medications: Scheduled Meds:  aspirin EC  81 mg Oral Daily   atorvastatin  40 mg Oral  Daily   metoprolol tartrate  12.5 mg Oral BID   Continuous Infusions:  heparin 1,600 Units/hr (06/20/23 0240)   lactated ringers 100 mL/hr at 06/19/23 2230   PRN Meds: acetaminophen, famotidine, HYDROcodone-acetaminophen, labetalol, nicotine polacrilex, nitroGLYCERIN, ondansetron (ZOFRAN) IV  Allergies:   No Known Allergies  Social History:   Social History   Socioeconomic History   Marital status: Single    Spouse name: Not on file   Number of children: Not on file   Years of education: Not  on file   Highest education level: Not on file  Occupational History   Not on file  Tobacco Use   Smoking status: Every Day    Types: Cigarettes   Smokeless tobacco: Never  Vaping Use   Vaping Use: Never used  Substance and Sexual Activity   Alcohol use: No   Drug use: Never   Sexual activity: Not on file  Other Topics Concern   Not on file  Social History Narrative   Lives alone, has roommate   Social Determinants of Health   Financial Resource Strain: Not on file  Food Insecurity: Not on file  Transportation Needs: Not on file  Physical Activity: Not on file  Stress: Not on file  Social Connections: Not on file  Intimate Partner Violence: Not on file    Family History:   History reviewed. No pertinent family history.  Patient does not have any knowledge of his family history.  ROS:  Please see the history of present illness.  Review of Systems  Cardiovascular:  Positive for chest pain.  Neurological:  Positive for tingling.    All other ROS reviewed and negative.     Physical Exam/Data:   Vitals:   06/20/23 0255 06/20/23 0330 06/20/23 0500 06/20/23 0531  BP: (!) 138/104 (!) 140/101 (!) 140/101 136/89  Pulse: 81 68 73   Resp: 18 (!) 21 18   Temp:      TempSrc:      SpO2: 92% 97% 97%   Weight:      Height:       No intake or output data in the 24 hours ending 06/20/23 0738    06/19/2023    6:02 PM 06/15/2023    6:22 AM 06/05/2023    1:22 PM  Last 3 Weights  Weight (lbs) 280 lb 272 lb 12.8 oz 272 lb 12.8 oz  Weight (kg) 127.007 kg 123.741 kg 123.741 kg     Body mass index is 42.57 kg/m.  General:  Well nourished, well developed, in no acute distress HEENT: normal Neck: no JVD Vascular: No carotid bruits; Distal pulses 2+ bilaterally Cardiac:  normal S1, S2; RRR; no murmur  Lungs:  clear to auscultation bilaterally, no wheezing, rhonchi or rales  Abd: soft, mildly tender, no hepatomegaly  Ext: trace edema Musculoskeletal:  No deformities, BUE and  BLE strength normal and equal Skin: warm and dry, stables intact to the umbilical area along with Dermabond noted on the left side of the abd in two separate areas Neuro:  CNs 2-12 intact, no focal abnormalities noted Psych:  Normal affect   EKG:  The EKG was personally reviewed and demonstrates: Sinus tachycardia with rate 109, diffuse ST elevation with subtle PR depression in inferior leads with no acute reciprocal changes Telemetry:  Telemetry was personally reviewed and demonstrates:  sinus with mild changes to ST segments  Relevant CV Studies:  Echocardiogram ordered and pending  Laboratory Data:  High Sensitivity Troponin:   Recent Labs  Lab 06/19/23 1808 06/19/23 2007  TROPONINIHS 2,186* 3,765*     Chemistry Recent Labs  Lab 06/19/23 1808  NA 138  K 4.2  CL 99  CO2 27  GLUCOSE 135*  BUN 16  CREATININE 1.37*  CALCIUM 11.5*  GFRNONAA >60  ANIONGAP 12    Recent Labs  Lab 07-06-2023 0145  PROT 7.2  ALBUMIN 4.0  AST 60*  ALT 53*  ALKPHOS 118  BILITOT 0.6   Lipids  Recent Labs  Lab Jul 06, 2023 0145  CHOL 236*  TRIG 365*  HDL 37*  LDLCALC 126*  CHOLHDL 6.4    Hematology Recent Labs  Lab 06/19/23 1808 07-06-23 0145  WBC 14.0* 15.0*  RBC 5.47 5.03  HGB 17.9* 16.4  HCT 51.9 48.3  MCV 94.9 96.0  MCH 32.7 32.6  MCHC 34.5 34.0  RDW 12.8 12.7  PLT 267 244   Thyroid No results for input(s): "TSH", "FREET4" in the last 168 hours.  BNPNo results for input(s): "BNP", "PROBNP" in the last 168 hours.  DDimer  Recent Labs  Lab 06/19/23 1808  DDIMER 0.63*     Radiology/Studies:  US RENAL  Result Date: July 06, 2023 CLINICAL DATA:  Chronic kidney disease EXAM: RENAL / URINARY TRACT ULTRASOUND COMPLETE COMPARISON:  10/18/2016 CT abdomen pelvis FINDINGS: Right Kidney: Renal measurements: 11.5 x 4.6 x 4.4 cm = volume: 122 mL. Echogenicity within normal limits. No mass or hydronephrosis visualized. Left Kidney: Renal measurements: 12.6 x 6.2 x 5.3 cm = volume:  217 mL. Echogenicity within normal limits. No mass or hydronephrosis visualized. Bladder: Appears normal for degree of bladder distention. Other: Incidental note of right adrenal adenoma, as seen on 10/18/2016 IMPRESSION: 1. Normal renal ultrasound. 2. Incidental note of right adrenal adenoma. Electronically Signed   By: Deatra Robinson M.D.   On: 2023-07-06 02:56   CT Angio Chest PE W/Cm &/Or Wo Cm  Result Date: 06/19/2023 CLINICAL DATA:  Chest pain.  Pulmonary embolism. EXAM: CT ANGIOGRAPHY CHEST WITH CONTRAST TECHNIQUE: Multidetector CT imaging of the chest was performed using the standard protocol during bolus administration of intravenous contrast. Multiplanar CT image reconstructions and MIPs were obtained to evaluate the vascular anatomy. RADIATION DOSE REDUCTION: This exam was performed according to the departmental dose-optimization program which includes automated exposure control, adjustment of the mA and/or kV according to patient size and/or use of iterative reconstruction technique. CONTRAST:  OMNIPAQUE IOHEXOL 350 MG/ML SOLN COMPARISON:  None Available. FINDINGS: Cardiovascular: Adequate opacification of the pulmonary arterial tree. No intraluminal filling defect identified to suggest acute pulmonary embolism. Central pulmonary arteries are of normal caliber. No significant coronary artery calcification. Cardiac size within normal limits. No pericardial effusion. No significant atherosclerotic calcification within the thoracic aorta. No aortic aneurysm. Mediastinum/Nodes: No enlarged mediastinal, hilar, or axillary lymph nodes. Thyroid gland, trachea, and esophagus demonstrate no significant findings. Lungs/Pleura: Lungs are clear. No pneumothorax or pleural effusion. No central obstructing lesion. Upper Abdomen: 2.8 cm benign adrenal adenoma within the right adrenal gland noted for which no follow-up imaging is recommended. No acute abnormality within the visualized upper abdomen.  Musculoskeletal: No chest wall abnormality. No acute or significant osseous findings. Review of the MIP images confirms the above findings. IMPRESSION: 1. No pulmonary embolism. No acute intrathoracic pathology identified. 2. 2.8 cm benign right adrenal adenoma. Electronically Signed   By: Helyn Numbers M.D.   On: 06/19/2023 20:06   DG Chest 2 View  Result Date: 06/19/2023 CLINICAL DATA:  Chest pain EXAM: CHEST - 2 VIEW COMPARISON:  10/23/2004 FINDINGS: Low lung volumes. Lungs are clear. No pleural effusion or pneumothorax. The heart is normal in size. Mild degenerative changes of the visualized thoracolumbar spine. IMPRESSION: Normal chest radiographs. Electronically Signed   By: Charline Bills M.D.   On: 06/19/2023 19:17     Assessment and Plan:   NSTEMI -Presented to the emergency department with chest pain -High-sensitivity troponins trended 2186 and 3765 -Additional high-sensitivity troponin ordered -Continued on heparin infusion -Clear liquid diet until 10 AM then n.p.o. -Repeat EKG ordered for concern of ST changes noted on arrival EKG -Scheduled for left heart catheterization later this afternoon -Further recommendations to follow post catheterization -Echocardiogram ordered and pending with further recommendations to follow -Continue on aspirin and statin -Continue with telemetry monitoring -EKG as needed for pain or changes  Elevated blood pressures without the diagnoses of hypertension -Blood pressure 140/101 recheck 136/89 -Metoprolol titrate 12.5 mg twice daily -Vital signs per unit protocol  AKI -Serum creatinine 1.37 -Repeat BMP ordered -Continued with gentle hydration after receiving IV contrast for CTA of the chest -Monitor urine output -Monitor/trend/replete electrolytes as needed -Avoid nephrotoxic agents were able  Tobacco abuse -Total cessation is recommended  Informed Consent   Shared Decision Making/Informed Consent The risks [stroke (1 in 1000),  death (1 in 1000), kidney failure [usually temporary] (1 in 500), bleeding (1 in 200), allergic reaction [possibly serious] (1 in 200)], benefits (diagnostic support and management of coronary artery disease) and alternatives of a cardiac catheterization were discussed in detail with Mr. Spannagel and he is willing to proceed.      Risk Assessment/Risk Scores:     TIMI Risk Score for Unstable Angina or Non-ST Elevation MI:   The patient's TIMI risk score is  , which indicates a  % risk of all cause mortality, new or recurrent myocardial infarction or need for urgent revascularization in the next 14 days.          For questions or updates, please contact [facility] Please consult [email or web address] for contact info under    Signed, Carlo Lorson, NP  06/20/2023 7:38 AM

## 2023-06-20 NOTE — Consult Note (Signed)
ANTICOAGULATION CONSULT NOTE   Pharmacy Consult for heparin infusion Indication: chest pain/ACS  No Known Allergies  Patient Measurements: Height: 5\' 8"  (172.7 cm) Weight: 127 kg (280 lb) IBW/kg (Calculated) : 68.4 Heparin Dosing Weight: 98 kg  Vital Signs: Temp: 99 F (37.2 C) (07/01 2232) BP: 136/89 (07/02 0531) Pulse Rate: 73 (07/02 0500)  Labs: Recent Labs    06/19/23 1808 06/19/23 1903 06/19/23 2007 06/20/23 0145 06/20/23 0942  HGB 17.9*  --   --  16.4  --   HCT 51.9  --   --  48.3  --   PLT 267  --   --  244  --   APTT  --  31  --   --   --   LABPROT  --  14.0  --   --   --   INR  --  1.1  --   --   --   HEPARINUNFRC  --   --   --  0.25* 0.26*  CREATININE 1.37*  --   --   --   --   TROPONINIHS 2,186*  --  3,765*  --   --      Estimated Creatinine Clearance: 88.4 mL/min (A) (by C-G formula based on SCr of 1.37 mg/dL (H)).   Medical History: Past Medical History:  Diagnosis Date   Complication of anesthesia    during colonoscopy kept waking up   GERD (gastroesophageal reflux disease)    History of kidney stones    Kidney stones     Medications:  No prior anticoagulation noted  Assessment: 47 year old male with past medical history of recent umbilical hernia repair procedure 06/15/23, presented with chest pain that runs down his left arm. Troponin elevated at 2186. Pharmacy has been consulted to initiate and manage IV heparin therapy.    7/2 0942 HL 0.26   Goal of Therapy:  Heparin level 0.3-0.7 units/ml Monitor platelets by anticoagulation protocol: Yes   Plan:  Heparin level is subtherapeutic. Will give heparin bolus of 2000 units x 1 and increase the heparin infusion to 1800 units/hr. Recheck heparin level in 6 hours. CBC daily while on heparin.   Ronnald Ramp, PharmD Clinical Pharmacist   06/20/2023,10:20 AM

## 2023-06-20 NOTE — ED Notes (Signed)
NPO except sips with meds explanation provided to pt and visitor who verbailze understanding.

## 2023-06-20 NOTE — Brief Op Note (Signed)
BRIEF CARDIAC CATHETERIZATION NOTE  06/20/2023  4:57 PM  PATIENT:  Maurice Ramos  47 y.o. male  PRE-OPERATIVE DIAGNOSIS:  non st elevation myocardial infarction  POST-OPERATIVE DIAGNOSIS:  * No post-op diagnosis entered *  PROCEDURE:  Procedure(s): LEFT HEART CATH AND CORONARY ANGIOGRAPHY (N/A) CORONARY STENT INTERVENTION (N/A)  SURGEON:  Surgeon(s) and Role:    * Josey Forcier, MD - Primary  FINDINGS: Severe single-vessel coronary artery disease with eccentric 90% stenosis in the mid LCx (MLA 1.3 mm^2 by OCT). Normal LVEF and LVEDP. Successful OCT-guided PCI using Onyx Frontier 2.5 x 12 mm drug-eluting stent with 0% residual stenosis and TIMI-3 flow.  RECOMMENDATIONS: DAPT with aspirin and ticagrelor for at least 12 months. Aggressive secondary prevention of CAD.  Yvonne Kendall, MD [facility]

## 2023-06-20 NOTE — Progress Notes (Signed)
*  PRELIMINARY RESULTS* Echocardiogram 2D Echocardiogram has been performed.  Maurice Ramos 06/20/2023, 1:57 PM

## 2023-06-21 ENCOUNTER — Other Ambulatory Visit (HOSPITAL_COMMUNITY): Payer: Self-pay

## 2023-06-21 ENCOUNTER — Encounter: Payer: Self-pay | Admitting: Internal Medicine

## 2023-06-21 DIAGNOSIS — I214 Non-ST elevation (NSTEMI) myocardial infarction: Secondary | ICD-10-CM | POA: Diagnosis not present

## 2023-06-21 DIAGNOSIS — Z720 Tobacco use: Secondary | ICD-10-CM | POA: Diagnosis not present

## 2023-06-21 DIAGNOSIS — R7309 Other abnormal glucose: Secondary | ICD-10-CM

## 2023-06-21 DIAGNOSIS — N182 Chronic kidney disease, stage 2 (mild): Secondary | ICD-10-CM | POA: Diagnosis not present

## 2023-06-21 DIAGNOSIS — E782 Mixed hyperlipidemia: Secondary | ICD-10-CM

## 2023-06-21 LAB — BASIC METABOLIC PANEL
Anion gap: 10 (ref 5–15)
Anion gap: 11 (ref 5–15)
BUN: 17 mg/dL (ref 6–20)
BUN: 17 mg/dL (ref 6–20)
CO2: 24 mmol/L (ref 22–32)
CO2: 25 mmol/L (ref 22–32)
Calcium: 9.4 mg/dL (ref 8.9–10.3)
Calcium: 9.1 mg/dL (ref 8.9–10.3)
Chloride: 103 mmol/L (ref 98–111)
Chloride: 102 mmol/L (ref 98–111)
Creatinine, Ser: 1.25 mg/dL — ABNORMAL HIGH (ref 0.61–1.24)
Creatinine, Ser: 1.29 mg/dL — ABNORMAL HIGH (ref 0.61–1.24)
GFR, Estimated: >60 mL/min (ref >60)
GFR, Estimated: >60 mL/min (ref >60)
Glucose, Bld: 118 mg/dL — ABNORMAL HIGH (ref 70–99)
Glucose, Bld: 106 mg/dL — ABNORMAL HIGH (ref 70–99)
Potassium: 3.8 mmol/L (ref 3.5–5.1)
Potassium: 4.1 mmol/L (ref 3.5–5.1)
Sodium: 137 mmol/L (ref 135–145)
Sodium: 138 mmol/L (ref 135–145)

## 2023-06-21 LAB — CBC WITH DIFFERENTIAL/PLATELET
Abs Immature Granulocytes: 0.07 10*3/uL (ref 0.00–0.07)
Basophils Absolute: 0.1 10*3/uL (ref 0.0–0.1)
Basophils Relative: 0 %
Eosinophils Absolute: 0.5 10*3/uL (ref 0.0–0.5)
Eosinophils Relative: 4 %
HCT: 45.5 % (ref 39.0–52.0)
Hemoglobin: 15.3 g/dL (ref 13.0–17.0)
Immature Granulocytes: 1 %
Lymphocytes Relative: 15 %
Lymphs Abs: 1.7 10*3/uL (ref 0.7–4.0)
MCH: 32.3 pg (ref 26.0–34.0)
MCHC: 33.6 g/dL (ref 30.0–36.0)
MCV: 96.2 fL (ref 80.0–100.0)
Monocytes Absolute: 1.1 10*3/uL — ABNORMAL HIGH (ref 0.1–1.0)
Monocytes Relative: 10 %
Neutro Abs: 8.1 10*3/uL — ABNORMAL HIGH (ref 1.7–7.7)
Neutrophils Relative %: 70 %
Platelets: 219 10*3/uL (ref 150–400)
RBC: 4.73 MIL/uL (ref 4.22–5.81)
RDW: 12.7 % (ref 11.5–15.5)
WBC: 11.5 10*3/uL — ABNORMAL HIGH (ref 4.0–10.5)
nRBC: 0 % (ref 0.0–0.2)

## 2023-06-21 LAB — GLUCOSE, CAPILLARY
Glucose-Capillary: 113 mg/dL — ABNORMAL HIGH (ref 70–99)
Glucose-Capillary: 112 mg/dL — ABNORMAL HIGH (ref 70–99)

## 2023-06-21 LAB — ERYTHROPOIETIN: Erythropoietin: 4.9 m[IU]/mL (ref 2.6–18.5)

## 2023-06-21 LAB — LIPOPROTEIN A (LPA): Lipoprotein (a): 39.6 nmol/L — ABNORMAL HIGH (ref <75.0)

## 2023-06-21 MED ORDER — ASPIRIN 81 MG PO TBEC: 81.0000 mg | DELAYED_RELEASE_TABLET | Freq: Every day | ORAL | 12 refills | Status: DC

## 2023-06-21 MED ORDER — NITROGLYCERIN 0.4 MG SL SUBL: 0.4000 mg | SUBLINGUAL_TABLET | SUBLINGUAL | 12 refills | Status: DC | PRN

## 2023-06-21 MED ORDER — NICOTINE POLACRILEX 2 MG MT GUM: 2.0000 mg | CHEWING_GUM | OROMUCOSAL | 0 refills | Status: DC | PRN

## 2023-06-21 MED ORDER — METOPROLOL SUCCINATE ER 25 MG PO TB24: 25.0000 mg | ORAL_TABLET | Freq: Every day | ORAL | Status: DC

## 2023-06-21 MED ORDER — TICAGRELOR 90 MG PO TABS: 90.0000 mg | ORAL_TABLET | Freq: Two times a day (BID) | ORAL | 3 refills | Status: DC

## 2023-06-21 MED ORDER — ATORVASTATIN CALCIUM 40 MG PO TABS: 40.0000 mg | ORAL_TABLET | Freq: Every day | ORAL | 0 refills | Status: DC

## 2023-06-21 MED ORDER — METOPROLOL SUCCINATE ER 25 MG PO TB24: 25.0000 mg | ORAL_TABLET | Freq: Every day | ORAL | 0 refills | Status: DC

## 2023-06-21 NOTE — Discharge Summary (Signed)
Physician Discharge Summary   Patient: Maurice Ramos MRN: [id number] DOB: [birth year]  Admit date:     06/19/2023  Discharge date: 06/21/23  Discharge Physician: Loyce Dys   PCP: Pcp, No   Discharge Diagnoses: NSTEMI (non-ST elevated myocardial infarction) (HCC) Leukocytosis likely reactive CKD (chronic kidney disease) Renal function currently at baseline  Hospital Course: Maurice Ramos is a 47 y.o. male with medical history significant of umbilical hernia repair RTAPP on 06/15/23.  By all accounts, patient did pretty well at the time.  Patient was found to have a incarcerated omentum in the hernia at the time.  Patient returned home, was eating drinking, moving bowels.  Patient was doing well till earlier the morning of presentation when he reports chest pain that was mild on and off present at rest EKG/pressure-like in the substernal area anteriorly radiating bilaterally especially left arm.  And somewhat to the back.  Patient actually recall having a similar episode of pain several months ago.  ER course is notable for patient finding of troponin markedly elevated, status post aspirin, heparin.  He was initiated ACS protocol and cardiologist was consulted.  Given concerns of NSTEMI patient was taken for cardiac catheterization.  Patient is currently status post PCI with drug-eluting stent.  Has been cleared by cardiology for discharge and will follow-up as an outpatient.  Consultants: Cardiology Procedures performed: Cardiac catheterization with PCI Disposition: Home Diet recommendation:  Discharge Diet Orders (From admission, onward)     Start     Ordered   06/21/23 0000  Diet - low sodium heart healthy        06/21/23 1312           Cardiac diet DISCHARGE MEDICATION: Allergies as of 06/21/2023   No Known Allergies      Medication List     TAKE these medications    aspirin EC 81 MG tablet Take 1 tablet (81 mg total) by mouth daily. Swallow whole. Start taking on: June 22, 2023   atorvastatin 40 MG tablet Commonly known as: LIPITOR Take 1 tablet (40 mg total) by mouth daily. Start taking on: June 22, 2023   famotidine 20 MG tablet Commonly known as: PEPCID Take 20 mg by mouth as needed for heartburn or indigestion.   HYDROcodone-acetaminophen 5-325 MG tablet Commonly known as: NORCO/VICODIN Take 1-2 tablets by mouth every 4 (four) hours as needed for moderate pain.   metoprolol succinate 25 MG 24 hr tablet Commonly known as: TOPROL-XL Take 1 tablet (25 mg total) by mouth daily. Start taking on: June 22, 2023   nicotine polacrilex 2 MG gum Commonly known as: NICORETTE Take 1 each (2 mg total) by mouth as needed for smoking cessation.   nitroGLYCERIN 0.4 MG SL tablet Commonly known as: NITROSTAT Place 1 tablet (0.4 mg total) under the tongue every 5 (five) minutes x 3 doses as needed for chest pain.   ticagrelor 90 MG Tabs tablet Commonly known as: BRILINTA Take 1 tablet (90 mg total) by mouth 2 (two) times daily.        Follow-up Information     End, Cristal Deer, MD. Schedule an appointment as soon as possible for a visit.   Specialty: Cardiology Contact information: [address] [place] [address] [phone]                Discharge Exam: Ceasar Mons Weights   06/19/23 1802  Weight: 127 kg   General: Middle-aged male obese not in acute distress Respiratory: Normal  breath sounds Cardiovascular exam: Heart sounds 1 and 2 normal Abdomen: Soft close obese Extremities: Normal pulses.  No edema CNS patient has awake and alert x 3  Condition at discharge: good     Discharge time spent: 34 minutes  Signed: Loyce Dys, MD [facility] 06/21/2023

## 2023-06-21 NOTE — Progress Notes (Signed)
Rounding Note    Patient Name: Maurice Ramos Date of Encounter: 06/21/2023  [facility] Cardiologist: Yvonne Kendall, MD   Subjective   Patient seen on a.m. rounds.  Is eager to go home.  Denies any further chest discomfort or abnormal sensation to his bilateral lower extremities.  Has been able to ambulate without any reoccurrence of symptoms.  Left heart catheterization completed yesterday without complications.  Inpatient Medications    Scheduled Meds:  aspirin EC  81 mg Oral Daily   atorvastatin  40 mg Oral Daily   enoxaparin (LOVENOX) injection  65 mg Subcutaneous Q24H   [START ON 06/22/2023] metoprolol succinate  25 mg Oral Daily   sodium chloride flush  3 mL Intravenous Q12H   sodium chloride flush  3 mL Intravenous Q12H   ticagrelor  90 mg Oral BID   Continuous Infusions:  sodium chloride     PRN Meds: sodium chloride, acetaminophen, famotidine, HYDROcodone-acetaminophen, labetalol, nicotine polacrilex, nitroGLYCERIN, ondansetron (ZOFRAN) IV, sodium chloride flush   Vital Signs    Vitals:   06/20/23 2003 06/20/23 2334 06/21/23 0331 06/21/23 0814  BP: (!) 146/84 121/87 114/67 125/86  Pulse: 78 67 75 71  Resp: 16 18 20 18   Temp: 98.6 F (37 C) 98.2 F (36.8 C) 98.8 F (37.1 C) 98.1 F (36.7 C)  TempSrc:  Oral    SpO2: 98% 97% 96% 90%  Weight:      Height:        Intake/Output Summary (Last 24 hours) at 06/21/2023 1130 Last data filed at 06/21/2023 0100 Gross per 24 hour  Intake 1088.75 ml  Output --  Net 1088.75 ml      06/19/2023    6:02 PM 06/15/2023    6:22 AM 06/05/2023    1:22 PM  Last 3 Weights  Weight (lbs) 280 lb 272 lb 12.8 oz 272 lb 12.8 oz  Weight (kg) 127.007 kg 123.741 kg 123.741 kg      Telemetry    Sinus rhythm with rate in the 80s- Personally Reviewed  ECG    No new tracings- Personally Reviewed  Physical Exam   GEN: No acute distress.   Neck: No JVD Cardiac: RRR, no murmurs, rubs, or gallops.  Right radial cath site  with gauze and OpSite dressing with 2+ radial pulse without bleeding, bruising, or hematoma noted Respiratory: Clear to auscultation bilaterally.  Respirations are unlabored at rest on room air GI: Soft, nontender, obese, non-distended  MS: No edema; No deformity. Neuro:  Nonfocal  Psych: Normal affect   Labs    High Sensitivity Troponin:   Recent Labs  Lab 06/19/23 1808 06/19/23 2007 06/20/23 0942  TROPONINIHS 2,186* 3,765* 8,196*     Chemistry Recent Labs  Lab 06/19/23 1808 06/20/23 0145 06/20/23 2013 06/21/23 0458  NA 138  --  137 138  K 4.2  --  3.8 4.1  CL 99  --  103 102  CO2 27  --  24 25  GLUCOSE 135*  --  118* 106*  BUN 16  --  17 17  CREATININE 1.37*  --  1.25* 1.29*  CALCIUM 11.5*  --  9.4 9.1  PROT  --  7.2  --   --   ALBUMIN  --  4.0  --   --   AST  --  60*  --   --   ALT  --  53*  --   --   ALKPHOS  --  118  --   --  BILITOT  --  0.6  --   --   GFRNONAA >60  --  >60 >60  ANIONGAP 12  --  10 11    Lipids  Recent Labs  Lab 06/20/23 0145  CHOL 236*  TRIG 365*  HDL 37*  LDLCALC 126*  CHOLHDL 6.4    Hematology Recent Labs  Lab 06/20/23 0145 06/20/23 2013 06/21/23 0458  WBC 15.0* 10.9* 11.5*  RBC 5.03 4.82 4.73  HGB 16.4 15.7 15.3  HCT 48.3 46.4 45.5  MCV 96.0 96.3 96.2  MCH 32.6 32.6 32.3  MCHC 34.0 33.8 33.6  RDW 12.7 12.9 12.7  PLT 244 209 219   Thyroid No results for input(s): "TSH", "FREET4" in the last 168 hours.  BNPNo results for input(s): "BNP", "PROBNP" in the last 168 hours.  DDimer  Recent Labs  Lab 06/19/23 1808  DDIMER 0.63*     Radiology    CARDIAC CATHETERIZATION  Result Date: 06/20/2023 Conclusions: Severe single-vessel coronary artery disease with focal, eccentric 90% stenosis in the proximal/mid LCx that is the culprit for the patient's NSTEMI.  There is also 40% mid/distal LAD disease. Normal left ventricular systolic function and filling pressure. Successful OCT-guided PCI to proximal/mid LCx using Onyx  Frontier 2.5 x 12 mm drug-eluting stent (postdilated to 3.1 mm with 0% residual stenosis and TIMI-3 flow. Unfavorable ascending aorta/aortic arch anatomy for right radial catheterization necessitating long sheath to engage the right coronary artery.  Consider alternative access for future catheterizations. Recommendations: Dual antiplatelet therapy with aspirin and ticagrelor for at least 12 months. Aggressive secondary prevention of coronary artery disease. Anticipate discharge home tomorrow if no postcatheterization complications ensue.  Recommend patient remain out of work until he follows up in our office in 1-2 weeks. Yvonne Kendall, MD [facility]  ECHOCARDIOGRAM COMPLETE  Result Date: 06/20/2023    ECHOCARDIOGRAM REPORT   Patient Name:   Maurice Ramos Date of Exam: 06/20/2023 Medical Rec #:  [id number]   Height:       68.0 in Accession #:    [id number]  Weight:       280.0 lb Date of Birth:  1976-10-28    BSA:          2.358 m Patient Age:    46 years    BP:           136/89 mmHg Patient Gender: M           HR:           72 bpm. Exam Location:  [facility] Procedure: 2D Echo, Cardiac Doppler and Color Doppler Indications:     NSTEMI  History:         Patient has no prior history of Echocardiogram examinations.                  Acute MI; Risk Factors:Current Smoker. CKD.  Sonographer:     Mikki Harbor Referring Phys:  [id number] [place] Doster Diagnosing Phys: Yvonne Kendall MD  Sonographer Comments: Technically difficult study due to poor echo windows and patient is obese. IMPRESSIONS  1. Left ventricular ejection fraction, by estimation, is 55 to 60%. The left ventricle has normal function. The left ventricle demonstrates regional wall motion abnormalities (see scoring diagram/findings for description). Left ventricular diastolic parameters are consistent with Grade II diastolic dysfunction (pseudonormalization). There is mild hypokinesis of the left ventricular, basal inferior and inferolateral segments.  2.  Right ventricular systolic function is normal. The right ventricular size is normal.  3. The mitral  valve is grossly normal. No evidence of mitral valve regurgitation. No evidence of mitral stenosis.  4. The aortic valve is tricuspid. Aortic valve regurgitation is not visualized. No aortic stenosis is present.  5. The inferior vena cava is normal in size with greater than 50% respiratory variability, suggesting right atrial pressure of 3 mmHg. FINDINGS  Left Ventricle: Left ventricular ejection fraction, by estimation, is 55 to 60%. The left ventricle has normal function. The left ventricle demonstrates regional wall motion abnormalities. Mild hypokinesis of the left ventricular, basal inferior and inferolateral segments. The left ventricular internal cavity size was normal in size. There is borderline left ventricular hypertrophy. Left ventricular diastolic parameters are consistent with Grade II diastolic dysfunction (pseudonormalization). Right Ventricle: The right ventricular size is normal. No increase in right ventricular wall thickness. Right ventricular systolic function is normal. Left Atrium: Left atrial size was normal in size. Right Atrium: Right atrial size was normal in size. Pericardium: There is no evidence of pericardial effusion. Mitral Valve: The mitral valve is grossly normal. No evidence of mitral valve regurgitation. No evidence of mitral valve stenosis. MV peak gradient, 3.9 mmHg. The mean mitral valve gradient is 1.0 mmHg. Tricuspid Valve: The tricuspid valve is not well visualized. Tricuspid valve regurgitation is not demonstrated. Aortic Valve: The aortic valve is tricuspid. Aortic valve regurgitation is not visualized. No aortic stenosis is present. Aortic valve mean gradient measures 5.0 mmHg. Aortic valve peak gradient measures 9.5 mmHg. Aortic valve area, by VTI measures 2.77 cm. Pulmonic Valve: The pulmonic valve was not well visualized. Pulmonic valve regurgitation is not visualized.  No evidence of pulmonic stenosis. Aorta: The aortic root is normal in size and structure. Pulmonary Artery: The pulmonary artery is not well seen. Venous: The inferior vena cava is normal in size with greater than 50% respiratory variability, suggesting right atrial pressure of 3 mmHg. IAS/Shunts: The interatrial septum was not well visualized.  LEFT VENTRICLE PLAX 2D LVIDd:         4.80 cm   Diastology LVIDs:         3.50 cm   LV e' medial:    7.51 cm/s LV PW:         1.00 cm   LV E/e' medial:  7.4 LV IVS:        1.10 cm   LV e' lateral:   9.03 cm/s LVOT diam:     2.10 cm   LV E/e' lateral: 6.1 LV SV:         67 LV SV Index:   28 LVOT Area:     3.46 cm  RIGHT VENTRICLE RV Basal diam:  3.50 cm RV Mid diam:    3.50 cm RV S prime:     12.70 cm/s TAPSE (M-mode): 3.0 cm LEFT ATRIUM             Index        RIGHT ATRIUM           Index LA diam:        3.60 cm 1.53 cm/m   RA Area:     16.70 cm LA Vol (A2C):   35.9 ml 15.23 ml/m  RA Volume:   47.40 ml  20.10 ml/m LA Vol (A4C):   34.1 ml 14.46 ml/m LA Biplane Vol: 34.6 ml 14.67 ml/m  AORTIC VALVE                     PULMONIC VALVE AV Area (Vmax):  2.52 cm      PV Vmax:       1.04 m/s AV Area (Vmean):   2.21 cm      PV Peak grad:  4.3 mmHg AV Area (VTI):     2.77 cm AV Vmax:           154.00 cm/s AV Vmean:          101.000 cm/s AV VTI:            0.240 m AV Peak Grad:      9.5 mmHg AV Mean Grad:      5.0 mmHg LVOT Vmax:         112.00 cm/s LVOT Vmean:        64.300 cm/s LVOT VTI:          0.192 m LVOT/AV VTI ratio: 0.80  AORTA Ao Root diam: 3.20 cm MITRAL VALVE MV Area (PHT): 3.37 cm    SHUNTS MV Area VTI:   2.13 cm    Systemic VTI:  0.19 m MV Peak grad:  3.9 mmHg    Systemic Diam: 2.10 cm MV Mean grad:  1.0 mmHg MV Vmax:       0.99 m/s MV Vmean:      52.9 cm/s MV Decel Time: 225 msec MV E velocity: 55.50 cm/s MV A velocity: 58.20 cm/s MV E/A ratio:  0.95 Cristal Deer End MD Electronically signed by Yvonne Kendall MD Signature Date/Time: 06/20/2023/7:37:49 PM     Final    US RENAL  Result Date: 06/20/2023 CLINICAL DATA:  Chronic kidney disease EXAM: RENAL / URINARY TRACT ULTRASOUND COMPLETE COMPARISON:  10/18/2016 CT abdomen pelvis FINDINGS: Right Kidney: Renal measurements: 11.5 x 4.6 x 4.4 cm = volume: 122 mL. Echogenicity within normal limits. No mass or hydronephrosis visualized. Left Kidney: Renal measurements: 12.6 x 6.2 x 5.3 cm = volume: 217 mL. Echogenicity within normal limits. No mass or hydronephrosis visualized. Bladder: Appears normal for degree of bladder distention. Other: Incidental note of right adrenal adenoma, as seen on 10/18/2016 IMPRESSION: 1. Normal renal ultrasound. 2. Incidental note of right adrenal adenoma. Electronically Signed   By: Deatra Robinson M.D.   On: 06/20/2023 02:56   CT Angio Chest PE W/Cm &/Or Wo Cm  Result Date: 06/19/2023 CLINICAL DATA:  Chest pain.  Pulmonary embolism. EXAM: CT ANGIOGRAPHY CHEST WITH CONTRAST TECHNIQUE: Multidetector CT imaging of the chest was performed using the standard protocol during bolus administration of intravenous contrast. Multiplanar CT image reconstructions and MIPs were obtained to evaluate the vascular anatomy. RADIATION DOSE REDUCTION: This exam was performed according to the departmental dose-optimization program which includes automated exposure control, adjustment of the mA and/or kV according to patient size and/or use of iterative reconstruction technique. CONTRAST:  OMNIPAQUE IOHEXOL 350 MG/ML SOLN COMPARISON:  None Available. FINDINGS: Cardiovascular: Adequate opacification of the pulmonary arterial tree. No intraluminal filling defect identified to suggest acute pulmonary embolism. Central pulmonary arteries are of normal caliber. No significant coronary artery calcification. Cardiac size within normal limits. No pericardial effusion. No significant atherosclerotic calcification within the thoracic aorta. No aortic aneurysm. Mediastinum/Nodes: No enlarged mediastinal, hilar, or  axillary lymph nodes. Thyroid gland, trachea, and esophagus demonstrate no significant findings. Lungs/Pleura: Lungs are clear. No pneumothorax or pleural effusion. No central obstructing lesion. Upper Abdomen: 2.8 cm benign adrenal adenoma within the right adrenal gland noted for which no follow-up imaging is recommended. No acute abnormality within the visualized upper abdomen. Musculoskeletal: No chest wall abnormality. No acute or significant  osseous findings. Review of the MIP images confirms the above findings. IMPRESSION: 1. No pulmonary embolism. No acute intrathoracic pathology identified. 2. 2.8 cm benign right adrenal adenoma. Electronically Signed   By: Helyn Numbers M.D.   On: 06/19/2023 20:06   DG Chest 2 View  Result Date: 06/19/2023 CLINICAL DATA:  Chest pain EXAM: CHEST - 2 VIEW COMPARISON:  10/23/2004 FINDINGS: Low lung volumes. Lungs are clear. No pleural effusion or pneumothorax. The heart is normal in size. Mild degenerative changes of the visualized thoracolumbar spine. IMPRESSION: Normal chest radiographs. Electronically Signed   By: Charline Bills M.D.   On: 06/19/2023 19:17    Cardiac Studies  LHC 06/20/23 Conclusions: Severe single-vessel coronary artery disease with focal, eccentric 90% stenosis in the proximal/mid LCx that is the culprit for the patient's NSTEMI.  There is also 40% mid/distal LAD disease. Normal left ventricular systolic function and filling pressure. Successful OCT-guided PCI to proximal/mid LCx using Onyx Frontier 2.5 x 12 mm drug-eluting stent (postdilated to 3.1 mm with 0% residual stenosis and TIMI-3 flow. Unfavorable ascending aorta/aortic arch anatomy for right radial catheterization necessitating long sheath to engage the right coronary artery.  Consider alternative access for future catheterizations.   Recommendations: Dual antiplatelet therapy with aspirin and ticagrelor for at least 12 months. Aggressive secondary prevention of coronary artery  disease. Anticipate discharge home tomorrow if no postcatheterization complications ensue.  Recommend patient remain out of work until he follows up in our office in 1-2 weeks.  Diagnostic Dominance: Right  Intervention     2D Echo 06/20/23 1. Left ventricular ejection fraction, by estimation, is 55 to 60%. The  left ventricle has normal function. The left ventricle demonstrates  regional wall motion abnormalities (see scoring diagram/findings for  description). Left ventricular diastolic  parameters are consistent with Grade II diastolic dysfunction  (pseudonormalization). There is mild hypokinesis of the left ventricular,  basal inferior and inferolateral segments.   2. Right ventricular systolic function is normal. The right ventricular  size is normal.   3. The mitral valve is grossly normal. No evidence of mitral valve  regurgitation. No evidence of mitral stenosis.   4. The aortic valve is tricuspid. Aortic valve regurgitation is not  visualized. No aortic stenosis is present.   5. The inferior vena cava is normal in size with greater than 50%  respiratory variability, suggesting right atrial pressure of 3 mmHg.   Patient Profile     47 y.o. male with a past medical history of recent umbilical hernia repair, gastroesophageal reflux disease, kidney stones, current smoker, who was seen and evaluated on 06/20/2023 for chest pain and elevated high-sensitivity troponin that underwent left heart catheterization with successful PCI/DES on 06/20/2023.  Assessment & Plan    NSTEMI -Presented with chest discomfort radiation to the bilateral upper extremities -Left heart catheterization completed with severe single-vessel coronary artery disease, eccentric 90% stenosis of proximal/mid LAC with successful OCT guided PCI/DES -Remains chest pain-free -Continued on DAPT for minimum of 12 months with aspirin and tricagrelor -Continued on atorvastatin 40 mg daily with a lipid and hepatic panel  in 10 to 12 weeks, LDL goal of 70 or less -Recommend tobacco cessation -Cardiac rehab consult -Will need to be discharged on aspirin 81 mg daily, atorvastatin 40 mg daily, Toprol-XL 25 mg daily, Brilinta 90 mg twice daily (a coupon and pharmacy education completed) as well as as needed Nitrostat 0.4 mg sublingual -Has already been educated on right radial aftercare and restrictions  Elevated blood pressures  without the diagnoses of hypertension -Blood pressure 125/86 -Continued on Toprol-XL 25 mg daily -Vital signs per unit protocol  AKI -Serum creatinine 1.29 -On arrival was 1.37 -Daily BMP -Monitor urine output -Monitor/trend/replete electrolytes as needed -Avoid nephrotoxic agents were able  Tobacco abuse -Total cessation is recommended -Did not find any current smoking cessation medications     For questions or updates, please contact [facility] Please consult [email or web address] for contact info under        Signed, Ivis Henneman, NP  06/21/2023, 11:30 AM

## 2023-06-21 NOTE — TOC Benefit Eligibility Note (Signed)
Pharmacy Patient Advocate Encounter  Insurance verification completed.    The patient is insured through [employer] for Brilinta 90 mg and the current 30 day co-pay is $25.00.   This test claim was processed through [facility]- copay amounts may vary at other pharmacies due to pharmacy/plan contracts, or as the patient moves through the different stages of their insurance plan.    Roland Earl, CPHT Pharmacy Patient Advocate Specialist [facility] Patient Advocate Team Direct Number: [phone]  Fax: [phone]

## 2023-06-21 NOTE — TOC CM/SW Note (Signed)
Transition of Care [facility]) - Inpatient Brief Assessment   Patient Details  Name: Maurice Ramos MRN: [id number] Date of Birth: 02/05/1976  [facility] CM/SW Contact:    Margarito Liner, LCSW Phone Number: 06/21/2023, 11:49 AM   Clinical Narrative: CSW reviewed chart. No TOC needs identified so far. CSW will continue to follow. Please place [facility] consult if any needs arise.  Transition of Care Asessment: Insurance and Status: Insurance coverage has been reviewed Patient has primary care physician: No Home environment has been reviewed: Single family home Prior level of function:: Not documented Prior/Current Home Services: No current home services Social Determinants of Health Reivew: SDOH reviewed no interventions necessary Readmission risk has been reviewed: Yes Transition of care needs: no transition of care needs at this time

## 2023-06-26 ENCOUNTER — Other Ambulatory Visit
Admission: RE | Admit: 2023-06-26 | Discharge: 2023-06-26 | Disposition: A | Discharge status: Home / Self Care | Payer: Managed Care, Other (non HMO) | Source: Ambulatory Visit | Attending: Cardiology | Admitting: Cardiology

## 2023-06-26 ENCOUNTER — Ambulatory Visit: Payer: Managed Care, Other (non HMO) | Attending: Cardiology | Admitting: Cardiology

## 2023-06-26 ENCOUNTER — Encounter: Payer: Self-pay | Admitting: Cardiology

## 2023-06-26 VITALS — BP 128/70 | HR 100 | Ht 68.0 in | Wt 271.2 lb

## 2023-06-26 DIAGNOSIS — N182 Chronic kidney disease, stage 2 (mild): Secondary | ICD-10-CM | POA: Diagnosis present

## 2023-06-26 DIAGNOSIS — I10 Essential (primary) hypertension: Secondary | ICD-10-CM | POA: Insufficient documentation

## 2023-06-26 DIAGNOSIS — I2510 Atherosclerotic heart disease of native coronary artery without angina pectoris: Secondary | ICD-10-CM

## 2023-06-26 DIAGNOSIS — Z720 Tobacco use: Secondary | ICD-10-CM

## 2023-06-26 DIAGNOSIS — E782 Mixed hyperlipidemia: Secondary | ICD-10-CM

## 2023-06-26 DIAGNOSIS — Z8719 Personal history of other diseases of the digestive system: Secondary | ICD-10-CM

## 2023-06-26 LAB — CBC
HCT: 46 % (ref 39.0–52.0)
Hemoglobin: 15.6 g/dL (ref 13.0–17.0)
MCH: 32.7 pg (ref 26.0–34.0)
MCHC: 33.9 g/dL (ref 30.0–36.0)
MCV: 96.4 fL (ref 80.0–100.0)
Platelets: 278 10*3/uL (ref 150–400)
RBC: 4.77 MIL/uL (ref 4.22–5.81)
RDW: 12.4 % (ref 11.5–15.5)
WBC: 11.1 10*3/uL — ABNORMAL HIGH (ref 4.0–10.5)
nRBC: 0 % (ref 0.0–0.2)

## 2023-06-26 LAB — BASIC METABOLIC PANEL
Anion gap: 11 (ref 5–15)
BUN: 14 mg/dL (ref 6–20)
CO2: 23 mmol/L (ref 22–32)
Calcium: 9.3 mg/dL (ref 8.9–10.3)
Chloride: 101 mmol/L (ref 98–111)
Creatinine, Ser: 1.23 mg/dL (ref 0.61–1.24)
GFR, Estimated: >60 mL/min (ref >60)
Glucose, Bld: 121 mg/dL — ABNORMAL HIGH (ref 70–99)
Potassium: 4.7 mmol/L (ref 3.5–5.1)
Sodium: 135 mmol/L (ref 135–145)

## 2023-06-26 LAB — HEPATIC FUNCTION PANEL
ALT: 63 U/L — ABNORMAL HIGH (ref 0–44)
AST: 44 U/L — ABNORMAL HIGH (ref 15–41)
Albumin: 3.8 g/dL (ref 3.5–5.0)
Alkaline Phosphatase: 141 U/L — ABNORMAL HIGH (ref 38–126)
Bilirubin, Direct: <0.1 mg/dL (ref 0.0–0.2)
Total Bilirubin: 0.5 mg/dL (ref 0.3–1.2)
Total Protein: 7.4 g/dL (ref 6.5–8.1)

## 2023-06-26 LAB — LIPID PANEL
Cholesterol: 114 mg/dL (ref 0–200)
HDL: 31 mg/dL — ABNORMAL LOW (ref >40)
LDL Cholesterol: 45 mg/dL (ref 0–99)
Total CHOL/HDL Ratio: 3.7 RATIO
Triglycerides: 189 mg/dL — ABNORMAL HIGH (ref <150)
VLDL: 38 mg/dL (ref 0–40)

## 2023-06-26 MED ORDER — NICOTINE 14 MG/24HR TD PT24: 14.0000 mg | MEDICATED_PATCH | Freq: Every day | TRANSDERMAL | 1 refills | Status: AC

## 2023-06-26 NOTE — Patient Instructions (Addendum)
Medication Instructions:  STOP taking Nicorette gum START Nicotine 14 mg patch daily as directed  *If you need a refill on your cardiac medications before your next appointment, please call your pharmacy*   Lab Work: CBC and BMP today Lipid and hepatic function panel in 8 weeks - Please go to the [facility]. You will check in at the front desk to the right as you walk into the atrium. Valet Parking is offered if needed. - No appointment needed. You may go any day between 7 am and 6 pm.  If you have labs (blood work) drawn today and your tests are completely normal, you will receive your results only by: MyChart Message (if you have MyChart) OR A paper copy in the mail If you have any lab test that is abnormal or we need to change your treatment, we will call you to review the results.   Testing/Procedures: No testing ordered  Follow-Up: At [facility], you and your health needs are our priority.  As part of our continuing mission to provide you with exceptional heart care, we have created designated Provider Care Teams.  These Care Teams include your primary Cardiologist (physician) and Advanced Practice Providers (APPs -  Physician Assistants and Nurse Practitioners) who all work together to provide you with the care you need, when you need it.  We recommend signing up for the patient portal called "MyChart".  Sign up information is provided on this After Visit Summary.  MyChart is used to connect with patients for Virtual Visits (Telemedicine).  Patients are able to view lab/test results, encounter notes, upcoming appointments, etc.  Non-urgent messages can be sent to your provider as well.   To learn more about what you can do with MyChart, go to [email or web address].    Your next appointment:   2 month(s)  Provider:   Charlsie Quest, NP

## 2023-06-26 NOTE — Progress Notes (Signed)
Cardiology Office Note:  .   Date:  06/26/2023  ID:  Maurice Ramos, DOB 1976/01/17, MRN [id number] PCP: Patient, No Pcp Per  [facility] Providers Cardiologist:  Yvonne Kendall, MD    History of Present Illness: .   Maurice Ramos is a 47 y.o. male with past medical history of abdominal hernia s/p repair, gastroesophageal reflux disease, kidney stones, current smoker, coronary artery disease, who is here today for follow-up after recent hospitalization for NSTEMI.  He presented to the emergency department at [facility] on 06/18/2021 with complaints of atypical chest pain that radiated to his bilateral arms.  He stated he had a chest pain and discomfort into his left lower arm that went up his arm across his chest and down his right arm that lasted for approximately 10 minutes.  He described the pain as a sharp pain that felt similar to the pain he had experienced in the past and attributed to nerve irritation that he had previously been treated with prednisone for.  He denied any other associated symptoms of shortness of breath, nausea vomiting or diaphoresis.  He had also recently undergone umbilical hernia repair that was uncomplicated and he was discharged home without incident.  Initial blood pressure was elevated 146/110, pulse 118, respirations of 18.  Pertinent lab blood glucose was 135, serum creatinine 1.37, calcium 11.5, WBCs 14, hemoglobin 17.9, D-dimer 0.60, troponin 2186, 3765, and 8196.  Chest x-ray revealed no acute cardiopulmonary findings and CT of the chest was negative for PE.  In the emergency department he was started on heparin infusion and given aspirin 325 mg.  He was scheduled for left heart catheterization and an echocardiogram.  Echocardiogram revealed LVEF 55 to 60%, G2 DD, without valvular abnormalities.  Left heart catheterization was completed and revealed severe single-vessel coronary artery disease with 90% stenosis in the proximal mid left circumflex, was considered the culprit  for the patient's NSTEMI.  There was also a 40% mid distal LAD disease.  He had successful CT guided PCI/DES to the proximal mid left circumflex.  Recommendation was to continue dual antiplatelet therapy with aspirin and Brilinta for minimum of 12 months and aggressive secondary prevention of coronary artery disease.  He was considered stable for discharge on 06/21/2023.  He returns to clinic today stating that he has been doing extremely well.  He denies any chest pain, shortness of breath, dizziness, back pain, neck pain, and has been working on decreasing smoking.  He is requesting nicotine patches today in place of his nicotine gum.  He has tolerated his medications and has been compliant without any missed doses.  He also needs a note to return to work.  He has already returned to work without any incident.  ROS: 10 point review of systems has been completed and considered negative with exception of what is been listed in the HPI  Studies Reviewed: Marland Kitchen   EKG Interpretation Date/Time:  Monday June 26 2023 14:47:05 EDT Ventricular Rate:  100 PR Interval:  180 QRS Duration:  80 QT Interval:  332 QTC Calculation: 428 R Axis:   74  Text Interpretation: Normal sinus rhythm Normal ECG When compared with ECG of 21-Jun-2023 05:46, Non-specific change in ST segment in Inferior leads Nonspecific T wave abnormality no longer evident in Anterolateral leads QT has lengthened Confirmed by Charlsie Quest ([id number]) on 06/26/2023 3:01:34 PM   LHC 06/20/23 Conclusions: Severe single-vessel coronary artery disease with focal, eccentric 90% stenosis in the proximal/mid LCx that is the culprit for  the patient's NSTEMI.  There is also 40% mid/distal LAD disease. Normal left ventricular systolic function and filling pressure. Successful OCT-guided PCI to proximal/mid LCx using Onyx Frontier 2.5 x 12 mm drug-eluting stent (postdilated to 3.1 mm with 0% residual stenosis and TIMI-3 flow. Unfavorable ascending aorta/aortic  arch anatomy for right radial catheterization necessitating long sheath to engage the right coronary artery.  Consider alternative access for future catheterizations.   Recommendations: Dual antiplatelet therapy with aspirin and ticagrelor for at least 12 months. Aggressive secondary prevention of coronary artery disease. Anticipate discharge home tomorrow if no postcatheterization complications ensue.  Recommend patient remain out of work until he follows up in our office in 1-2 weeks.  TTE 06/20/23  1. Left ventricular ejection fraction, by estimation, is 55 to 60%. The  left ventricle has normal function. The left ventricle demonstrates  regional wall motion abnormalities (see scoring diagram/findings for  description). Left ventricular diastolic  parameters are consistent with Grade II diastolic dysfunction  (pseudonormalization). There is mild hypokinesis of the left ventricular,  basal inferior and inferolateral segments.   2. Right ventricular systolic function is normal. The right ventricular  size is normal.   3. The mitral valve is grossly normal. No evidence of mitral valve  regurgitation. No evidence of mitral stenosis.   4. The aortic valve is tricuspid. Aortic valve regurgitation is not  visualized. No aortic stenosis is present.   5. The inferior vena cava is normal in size with greater than 50%  respiratory variability, suggesting right atrial pressure of 3 mmHg.   Risk Assessment/Calculations:             Physical Exam:   VS:  BP 128/70 (BP Location: Left Arm, Patient Position: Sitting, Cuff Size: Large)   Pulse 100   Ht 5\' 8"  (1.727 m)   Wt 271 lb 3.2 oz (123 kg)   SpO2 94%   BMI 41.24 kg/m    Wt Readings from Last 3 Encounters:  06/26/23 271 lb 3.2 oz (123 kg)  06/19/23 280 lb (127 kg)  06/15/23 272 lb 12.8 oz (123.7 kg)    GEN: Well nourished, well developed in no acute distress NECK: No JVD; No carotid bruits CARDIAC: RRR, no murmurs, rubs, gallops, right  radial cath site without bleeding, bruising, or hematoma, 2+ radial pulse RESPIRATORY:  Clear to auscultation without rales, wheezing or rhonchi  ABDOMEN: Soft, non-tender, non-distended EXTREMITIES:  No edema; No deformity   ASSESSMENT AND PLAN: .   Coronary artery disease of native coronary artery without angina.  Patient presented to the hospital with NSTEMI on 06/19/2023.  High-sensitivity troponins peaked at over 8000 was subsequently taken to the Cath Lab which revealed severe single-vessel coronary artery disease with medical send check 90% stenosis in the proximal/mid left circumflex that was the culprit for the patient's NSTEMI.  There was also a 40% mid/distal LAD disease.  He had successful OCT guided PCI to the proximal/mid left circumflex.  EKG today reveals sinus tachycardia without any ischemic changes.  He is with chest pain or anginal equivalents.  He is continued on aspirin 81 mg daily, Brilinta 90 mg twice daily, and Lipitor 40 mg daily.  He continues to have Nitrostat as needed and was given continued explanation on medication use and side effects.  Has also been advised that they will be contacted by cardiac rehab and that overall indications of the program.  He is also being sent for CBC and BMP today.  Mixed hyperlipidemia where he was  started on atorvastatin 40 mg daily.  He will need follow-up hepatic and lipid panel in 8 weeks to reevaluate LDL.  Chronic kidney disease with baseline serum creatinine 1.3-1.4.  He has been advised to avoid NSAIDs.  He is continued on his current medication regimen.  He has also being sent for follow-up in [place] today to reevaluate kidney function.  Tobacco abuse continuation of trying to quit.  Had recently been given a great that was unsuccessful.  He is requesting nicotine patches today [facility].  Umbilical hernia status post repair on 06/15/2023.  Continues to follow with general surgery.  Has  been released from the standpoint to return back to work.   Cardiac Rehabilitation Eligibility Assessment         Dispo: Patient to return to clinic to see MD/APP in the next 6 to 8 weeks or sooner if needed to reevaluate symptoms.  He is also requesting to return to work today.  Signed, Ola Fawver, NP

## 2023-06-28 NOTE — Progress Notes (Signed)
Labs are stable. Cholesterol and bad cholesterol have improved with medications. Recommend repeating hepatic panel in 6-8 weeks. Continue current medication regimen.

## 2023-06-29 ENCOUNTER — Encounter: Payer: Self-pay | Admitting: Physician Assistant

## 2023-06-29 ENCOUNTER — Ambulatory Visit (INDEPENDENT_AMBULATORY_CARE_PROVIDER_SITE_OTHER): Payer: Managed Care, Other (non HMO) | Admitting: Physician Assistant

## 2023-06-29 VITALS — BP 118/83 | HR 92 | Temp 98.0°F | Ht 69.0 in | Wt 269.0 lb

## 2023-06-29 DIAGNOSIS — K420 Umbilical hernia with obstruction, without gangrene: Secondary | ICD-10-CM | POA: Diagnosis not present

## 2023-06-29 DIAGNOSIS — K429 Umbilical hernia without obstruction or gangrene: Secondary | ICD-10-CM

## 2023-06-29 DIAGNOSIS — Z09 Encounter for follow-up examination after completed treatment for conditions other than malignant neoplasm: Secondary | ICD-10-CM

## 2023-06-29 NOTE — Progress Notes (Signed)
[facility] POST-OP OFFICE VISIT  06/29/2023  HPI: Maurice Ramos is a 47 y.o. male 14 days s/p robotic assisted (rTAPP) ventral hernia repair with Dr Everlene Farrier.   He did unfortunately have presentation for chest pain on 07/01 and found to have NSTEMI. Taken for catheterization on 07/02 and found to have single vessel disease LCx. Discharged 07/03. He denied any further chest pain or SOB. Reports "he feels the best he has in a while."  From a surgical perspective, he reports no issues. He states he had "almost no pain." No fever, chills, nausea, emesis. No trouble with bowel movements. Incisions well healed. Ambulating well. No other or new complaints.   Vital signs: BP 118/83   Pulse 92   Temp 98 F (36.7 C)   Ht 5\' 9"  (1.753 m)   Wt 269 lb (122 kg)   SpO2 97%   BMI 39.72 kg/m    Physical Exam: Constitutional: Well appearing male, NAD Abdomen: Soft, non-tender, non-distended, no rebound/guarding Skin: Laparoscopic incisions are healing well, no erythema or drainage   Assessment/Plan: This is a 47 y.o. male 14 days s/p robotic assisted (rTAPP) ventral hernia repair with Dr Everlene Farrier.    - Pain control prn  - Reviewed wound care recommendation  - Reviewed lifting restrictions; 6 weeks total  - He can follow up on as needed basis; He understands to call with questions/concerns  -- Lynden Oxford, PA-C [facility] 06/29/2023, 2:07 PM M-F: 7am - 4pm

## 2023-06-29 NOTE — Patient Instructions (Signed)

## 2023-06-30 ENCOUNTER — Other Ambulatory Visit: Payer: Self-pay

## 2023-06-30 DIAGNOSIS — I2510 Atherosclerotic heart disease of native coronary artery without angina pectoris: Secondary | ICD-10-CM

## 2023-06-30 DIAGNOSIS — E782 Mixed hyperlipidemia: Secondary | ICD-10-CM

## 2023-07-13 ENCOUNTER — Telehealth: Payer: Self-pay | Admitting: Internal Medicine

## 2023-07-13 MED ORDER — ATORVASTATIN CALCIUM 40 MG PO TABS: 40.0000 mg | ORAL_TABLET | Freq: Every day | ORAL | 3 refills | Status: DC

## 2023-07-13 MED ORDER — ASPIRIN 81 MG PO TBEC: 81.0000 mg | DELAYED_RELEASE_TABLET | Freq: Every day | ORAL | 3 refills | Status: DC

## 2023-07-13 MED ORDER — FAMOTIDINE 20 MG PO TABS: 20.0000 mg | ORAL_TABLET | ORAL | 3 refills | Status: DC | PRN

## 2023-07-13 MED ORDER — TICAGRELOR 90 MG PO TABS: 90.0000 mg | ORAL_TABLET | Freq: Two times a day (BID) | ORAL | 3 refills | Status: DC

## 2023-07-13 MED ORDER — NITROGLYCERIN 0.4 MG SL SUBL: 0.4000 mg | SUBLINGUAL_TABLET | SUBLINGUAL | 3 refills | Status: AC | PRN

## 2023-07-13 MED ORDER — METOPROLOL SUCCINATE ER 25 MG PO TB24: 25.0000 mg | ORAL_TABLET | Freq: Every day | ORAL | 3 refills | Status: DC

## 2023-07-13 NOTE — Telephone Encounter (Signed)
*  STAT* If patient is at the pharmacy, call can be transferred to refill team.   1. Which medications need to be refilled? (please list name of each medication and dose if known)   aspirin EC 81 MG tablet    atorvastatin (LIPITOR) 40 MG tablet   famotidine (PEPCID) 20 MG tablet   metoprolol succinate (TOPROL-XL) 25 MG 24 hr tablet   nitroGLYCERIN (NITROSTAT) 0.4 MG SL tablet    ticagrelor (BRILINTA) 90 MG TABS tablet    2. Which pharmacy/location (including street and city if local pharmacy) is medication to be sent to? [id number] [place], [place] - [address]   3. Do they need a 30 day or 90 day supply? 90

## 2023-07-17 NOTE — Telephone Encounter (Signed)
*  STAT* If patient is at the pharmacy, call can be transferred to refill team.   1. Which medications need to be refilled? (please list name of each medication and dose if known)   atorvastatin (LIPITOR) 40 MG tablet  ticagrelor (BRILINTA) 90 MG TABS tablet  metoprolol succinate (TOPROL-XL) 25 MG 24 hr tablet   2. Would you like to learn more about the convenience, safety, & potential cost savings by using the [facility]?   3. Are you open to using the [facility] (Type [facility]. ).  4. Which pharmacy/location (including street and city if local pharmacy) is medication to be sent to?  [id number] [place], [place] - [address]   5. Do they need a 30 day or 90 day supply?   90 day  Patient stated he is completely out of these medications and will be going out of town today.  Patient needs medication resent to pharmacy.  Patient has appointment on 9/10.

## 2023-07-17 NOTE — Telephone Encounter (Signed)
rx with year supply sent/receipt confirmed on 07/12/23.  Attempt to call patient to update him on medications but no answer and not able to leave voicmail.    Spoke to pharmacy and they did receives refills.  They also stated that patient picked up the meds today.

## 2023-08-24 ENCOUNTER — Other Ambulatory Visit
Admission: RE | Admit: 2023-08-24 | Discharge: 2023-08-24 | Disposition: A | Discharge status: Home / Self Care | Payer: Managed Care, Other (non HMO) | Source: Ambulatory Visit | Attending: Cardiology | Admitting: Cardiology

## 2023-08-24 DIAGNOSIS — E782 Mixed hyperlipidemia: Secondary | ICD-10-CM | POA: Diagnosis present

## 2023-08-24 DIAGNOSIS — I2510 Atherosclerotic heart disease of native coronary artery without angina pectoris: Secondary | ICD-10-CM | POA: Insufficient documentation

## 2023-08-24 LAB — HEPATIC FUNCTION PANEL
ALT: 34 U/L (ref 0–44)
AST: 29 U/L (ref 15–41)
Albumin: 3.8 g/dL (ref 3.5–5.0)
Alkaline Phosphatase: 118 U/L (ref 38–126)
Bilirubin, Direct: <0.1 mg/dL (ref 0.0–0.2)
Total Bilirubin: 0.6 mg/dL (ref 0.3–1.2)
Total Protein: 7 g/dL (ref 6.5–8.1)

## 2023-08-25 NOTE — Progress Notes (Signed)
Liver panel has improved.  Continue current medication regimen without changes needed at this time.

## 2023-08-28 NOTE — Group Note (Deleted)

## 2023-08-29 ENCOUNTER — Ambulatory Visit: Payer: Managed Care, Other (non HMO) | Admitting: Cardiology

## 2023-08-29 NOTE — Progress Notes (Deleted)
Cardiology Office Note:  .   Date:  08/29/2023  ID:  [name], DOB Apr 20, 1976, MRN [id number] PCP: Patient, No Pcp Per  [facility] Providers Cardiologist:  Yvonne Kendall, MD { Click to update primary MD,subspecialty MD or APP then REFRESH:1}   History of Present Illness: .   Dewy Kolodziej is a 47 y.o. male with past medical history of abdominal hernia s/p repair, gastroesophageal reflux disease, kidney stones, current smoker, coronary artery disease who was recently hospitalized for an NSTEMI, who is here today for follow-up.  He presented to [facility] emergency department on 06/2023 with complaints of atypical chest pain that radiated to his bilateral arms.  High-sensitivity troponin peaked at 8196.  Echocardiogram revealed LVEF 55 to 60%, G2 DD, without valvular abnormalities.  Left heart catheterization was completed and revealed severe single-vessel coronary artery disease with 90% stenosis in the proximal mid left circumflex which was considered the culprit for his NSTEMI.  There was also a 40% mid distal LAD disease.  He had successful CT guided PCI/DES to the proximal mid left circumflex.  Recommendation was to continue dual antiplatelet therapy with aspirin and Brilinta for minimum of 12 months and aggressive secondary prevention of coronary artery disease.  He was considered stable and subsequently discharged from the facility on 06/21/2023.  He was last seen in clinic 06/26/2023 at that time he had been doing extremely well.  He denied any anginal or anginal equivalent symptoms.  He had already returned to work without incident.  He been compliant with his current medication regimen.  He was sent for labs.  And prescription sent for nicotine patches to the pharmacy of choice.  He returns to clinic today  ROS: 10 point review of systems has been reviewed and considered negative with exception of what is been listed in the HPI  Studies Reviewed: Marland Kitchen        LHC  06/20/23 Conclusions: Severe single-vessel coronary artery disease with focal, eccentric 90% stenosis in the proximal/mid LCx that is the culprit for the patient's NSTEMI.  There is also 40% mid/distal LAD disease. Normal left ventricular systolic function and filling pressure. Successful OCT-guided PCI to proximal/mid LCx using Onyx Frontier 2.5 x 12 mm drug-eluting stent (postdilated to 3.1 mm with 0% residual stenosis and TIMI-3 flow. Unfavorable ascending aorta/aortic arch anatomy for right radial catheterization necessitating long sheath to engage the right coronary artery.  Consider alternative access for future catheterizations.   Recommendations: Dual antiplatelet therapy with aspirin and ticagrelor for at least 12 months. Aggressive secondary prevention of coronary artery disease. Anticipate discharge home tomorrow if no postcatheterization complications ensue.  Recommend patient remain out of work until he follows up in our office in 1-2 weeks.   TTE 06/20/23  1. Left ventricular ejection fraction, by estimation, is 55 to 60%. The  left ventricle has normal function. The left ventricle demonstrates  regional wall motion abnormalities (see scoring diagram/findings for  description). Left ventricular diastolic  parameters are consistent with Grade II diastolic dysfunction  (pseudonormalization). There is mild hypokinesis of the left ventricular,  basal inferior and inferolateral segments.   2. Right ventricular systolic function is normal. The right ventricular  size is normal.   3. The mitral valve is grossly normal. No evidence of mitral valve  regurgitation. No evidence of mitral stenosis.   4. The aortic valve is tricuspid. Aortic valve regurgitation is not  visualized. No aortic stenosis is present.   5. The inferior vena cava is normal in size  with greater than 50%  respiratory variability, suggesting right atrial pressure of 3 mmHg Risk Assessment/Calculations:     No BP  recorded.  {Refresh Note OR Click here to enter BP  :1}***       Physical Exam:   VS:  There were no vitals taken for this visit.   Wt Readings from Last 3 Encounters:  06/29/23 269 lb (122 kg)  06/26/23 271 lb 3.2 oz (123 kg)  06/19/23 280 lb (127 kg)    GEN: Well nourished, well developed in no acute distress NECK: No JVD; No carotid bruits CARDIAC: ***RRR, no murmurs, rubs, gallops RESPIRATORY:  Clear to auscultation without rales, wheezing or rhonchi  ABDOMEN: Soft, non-tender, non-distended EXTREMITIES:  No edema; No deformity   ASSESSMENT AND PLAN: .   ***    {Are you ordering a CV Procedure (e.g. stress test, cath, DCCV, TEE, etc)?   Press F2        :[id number]}  Dispo: ***  Signed, Joshiah Traynham, NP

## 2023-08-30 ENCOUNTER — Encounter: Payer: Self-pay | Admitting: Internal Medicine

## 2023-08-30 ENCOUNTER — Ambulatory Visit: Payer: Managed Care, Other (non HMO) | Attending: Cardiology | Admitting: Internal Medicine

## 2023-08-30 VITALS — BP 136/84 | HR 86 | Ht 68.0 in | Wt 279.0 lb

## 2023-08-30 DIAGNOSIS — Z720 Tobacco use: Secondary | ICD-10-CM

## 2023-08-30 DIAGNOSIS — E782 Mixed hyperlipidemia: Secondary | ICD-10-CM

## 2023-08-30 DIAGNOSIS — I2510 Atherosclerotic heart disease of native coronary artery without angina pectoris: Secondary | ICD-10-CM | POA: Diagnosis not present

## 2023-08-30 MED ORDER — TICAGRELOR 90 MG PO TABS: 90.0000 mg | ORAL_TABLET | Freq: Two times a day (BID) | ORAL | 3 refills | Status: DC

## 2023-08-30 MED ORDER — ATORVASTATIN CALCIUM 40 MG PO TABS: 40.0000 mg | ORAL_TABLET | Freq: Every day | ORAL | 3 refills | Status: DC

## 2023-08-30 MED ORDER — METOPROLOL SUCCINATE ER 25 MG PO TB24: 25.0000 mg | ORAL_TABLET | Freq: Every day | ORAL | 3 refills | Status: DC

## 2023-08-30 NOTE — Progress Notes (Signed)
Cardiology Office Note:  .   Date:  09/01/2023  ID:  Maurice Ramos, DOB 1976-03-02, MRN [id number] PCP: Patient, No Pcp Per  [facility] Providers Cardiologist:  Yvonne Kendall, MD     History of Present Illness: .   Maurice Ramos is a 47 y.o. male with history of coronary artery disease with NSTEMI status post PCI to proximal LCx in 06/2023), hyperlipidemia, chronic kidney disease stage III, GERD, kidney stones, and tobacco abuse, who presents for follow-up of coronary artery disease.  He was seen in follow-up in early July by Charlsie Quest, NP, at which time he was doing very well.  He was encouraged to quit smoking.  Today, Mr. Wixon reports that he is feeling quite well.  He is using nicotine patches and is only smoking about 1 cigarette/day.  He is trying to quit altogether.  He denies chest pain, shortness of breath, palpitations, lightheadedness, and edema.  He is frustrated by his inability to lose weight despite trying to change his diet and remaining very active at work.  His only concern is that he seems to have more skin irritation along his medial thighs following his MI.  He wonders if medications could be contributing to this.  ROS: See HPI  Studies Reviewed: Marland Kitchen        LHC/PCI (06/20/2023): LMCA normal.  Distal LAD with 40% stenosis.  90% proximal/mid LCx lesion.  RCA without significant disease.  Successful PCI to proximal/mid LCx using Onyx frontier 2.5 x 12 mm drug-eluting stent.  TTE (06/20/2023): Normal LV size borderline LVH.  LVEF 55-60% with mild hypokinesis of the basal inferior and inferolateral segments.  Grade 2 diastolic dysfunction noted.  Normal RV size and function.  Normal biatrial size.  No pericardial effusion.  No significant valvular abnormality.  Normal CVP.  Risk Assessment/Calculations:           Physical Exam:   VS:  BP 136/84 (BP Location: Left Arm, Patient Position: Sitting, Cuff Size: Normal)   Pulse 86   Ht 5\' 8"  (1.727 m)   Wt 279 lb (126.6 kg)    SpO2 93%   BMI 42.42 kg/m    Wt Readings from Last 3 Encounters:  08/30/23 279 lb (126.6 kg)  06/29/23 269 lb (122 kg)  06/26/23 271 lb 3.2 oz (123 kg)    General:  NAD. Neck: Unable to assess JVP due to body habitus. Lungs: Clear to auscultation bilaterally without wheezes or crackles. Heart: Regular rate and rhythm without murmurs, rubs, or gallops. Abdomen: Soft, nontender, nondistended. Extremities: No lower extremity edema.  ASSESSMENT AND PLAN: .    Coronary artery disease: Mr. Free has recovered well from his NSTEMI and PCI to the LCx in July.  We will plan to continue dual antiplatelet therapy with aspirin and ticagrelor for at least 12 months.  We discussed that it seems unlikely that any of his medications started at the time of his NSTEMI are contributing to the skin irritation along his medial thighs.  However, if this does not improve over the next few months, we could consider stepwise discontinuation of some of his medications to see if symptoms resolve.  Hyperlipidemia: Continue atorvastatin 40 mg daily.  LDL well-controlled on last check.  Tobacco abuse: I congratulated Mr. Salon on cutting down on his smoking.  I encouraged him to quit altogether, with the assistance of nicotine patches if needed.  Morbid obesity: BMI remains greater than 40.  We discussed weight loss strategies and agreed to defer  pharmacotherapy at this time.    Dispo: Return to clinic in 6 months.  Signed, Yvonne Kendall, MD

## 2023-08-30 NOTE — Patient Instructions (Signed)

## 2023-09-01 ENCOUNTER — Encounter: Payer: Self-pay | Admitting: Internal Medicine

## 2023-09-01 DIAGNOSIS — I2510 Atherosclerotic heart disease of native coronary artery without angina pectoris: Secondary | ICD-10-CM | POA: Insufficient documentation

## 2023-11-23 ENCOUNTER — Telehealth: Payer: Self-pay | Admitting: Internal Medicine

## 2023-11-23 DIAGNOSIS — Z79899 Other long term (current) drug therapy: Secondary | ICD-10-CM

## 2023-11-23 NOTE — Telephone Encounter (Signed)
Called and spoke with patient. Patient states that he has not been taking his Atorvastatin for 2 weeks. Patient reports that he stopped taking the medication because he was having cramping in his hands and legs. Patient states that the cramping has stopped since he stopped the medication. Patient requesting an alternative medication for his cholesterol. Will forward to MD for recommendations.

## 2023-11-23 NOTE — Telephone Encounter (Signed)
Pt c/o medication issue:  1. Name of Medication:  Cholesterol medication  2. How are you currently taking this medication (dosage and times per day)?   3. Are you having a reaction (difficulty breathing--STAT)?   4. What is your medication issue?   Caller Fleet Contras - not on contact list) requested a call back directly to patient and stated patient wants to change his medication to a higher dosage.

## 2023-11-23 NOTE — Telephone Encounter (Signed)
Given symptoms associated with atorvastatin, I recommend that we switch Maurice Ramos to rosuvastatin 5 mg daily and check a fasting lipid panel and ALT in ~3 months.  Yvonne Kendall, MD [facility]

## 2023-11-24 NOTE — Telephone Encounter (Signed)
Left a message for the patient to call back.  

## 2023-11-28 MED ORDER — ROSUVASTATIN CALCIUM 5 MG PO TABS: 5.0000 mg | ORAL_TABLET | Freq: Every day | ORAL | 3 refills | Status: DC

## 2023-11-28 NOTE — Telephone Encounter (Signed)
Pt made aware of recommendations and verbalized understanding Medication list updated and repeat lab orders placed

## 2023-11-28 NOTE — Addendum Note (Signed)
Addended by: Parke Poisson on: 11/28/2023 10:30 AM   Modules accepted: Orders

## 2023-12-27 ENCOUNTER — Encounter: Payer: Self-pay | Admitting: Internal Medicine

## 2023-12-27 ENCOUNTER — Telehealth: Payer: Self-pay | Admitting: Internal Medicine

## 2023-12-27 MED ORDER — FAMOTIDINE 20 MG PO TABS: 20.0000 mg | ORAL_TABLET | ORAL | 0 refills | Status: AC | PRN

## 2023-12-27 MED ORDER — TICAGRELOR 90 MG PO TABS: 90.0000 mg | ORAL_TABLET | Freq: Two times a day (BID) | ORAL | 0 refills | Status: DC

## 2023-12-27 MED ORDER — ROSUVASTATIN CALCIUM 5 MG PO TABS: 5.0000 mg | ORAL_TABLET | Freq: Every day | ORAL | 0 refills | Status: DC

## 2023-12-27 MED ORDER — METOPROLOL SUCCINATE ER 25 MG PO TB24: 25.0000 mg | ORAL_TABLET | Freq: Every day | ORAL | 0 refills | Status: DC

## 2023-12-27 NOTE — Telephone Encounter (Signed)
 Error

## 2023-12-27 NOTE — Telephone Encounter (Signed)
 Patient [facility] [address] Sherlene Shams have been sent.

## 2023-12-27 NOTE — Telephone Encounter (Signed)
 Pt c/o medication issue:  1. Name of Medication:   rosuvastatin  (CRESTOR ) 5 MG tablet    2. How are you currently taking this medication (dosage and times per day)? Not currently taking   3. Are you having a reaction (difficulty breathing--STAT)? No   4. What is your medication issue? [facility]   Patient is also requesting his Brilinta  and aspirin  be sent as 180 tablets due to him taking it twice a day and needing it to last him for the full 90 days.   He states he lives/works 4 hours away from this pharmacy so he would like a callback from the office notifying him when these changes have been made and sent.   Please advise.

## 2024-02-28 ENCOUNTER — Ambulatory Visit: Payer: Managed Care, Other (non HMO) | Attending: Internal Medicine | Admitting: Internal Medicine

## 2024-02-28 NOTE — Progress Notes (Deleted)
  Cardiology Office Note:  .   Date:  02/28/2024  ID:  [name], DOB 1976/09/23, MRN [id number] PCP: Patient, No Pcp Per  [facility] Providers Cardiologist:  Yvonne Kendall, MD { Click to update primary MD,subspecialty MD or APP then REFRESH:1}    History of Present Illness: .   Maurice Ramos is a 48 y.o. male with history of coronary artery disease with NSTEMI status post PCI to proximal LCx in 06/2023), hyperlipidemia, chronic kidney disease stage III, GERD, kidney stones, and tobacco abuse, who presents for follow-up of coronary artery disease.  I last saw him in 08/2023, at which time he was feeling well.  He was trying to quit smoking.  We did not make any medication changes or pursue additional testing.  ROS: See HPI  Studies Reviewed: Marland Kitchen        LHC/PCI (06/20/2023): LMCA normal.  Distal LAD with 40% stenosis.  90% proximal/mid LCx lesion.  RCA without significant disease.  Successful PCI to proximal/mid LCx using Onyx frontier 2.5 x 12 mm drug-eluting stent.   TTE (06/20/2023): Normal LV size borderline LVH.  LVEF 55-60% with mild hypokinesis of the basal inferior and inferolateral segments.  Grade 2 diastolic dysfunction noted.  Normal RV size and function.  Normal biatrial size.  No pericardial effusion.  No significant valvular abnormality.  Normal CVP. Risk Assessment/Calculations:   {Does this patient have ATRIAL FIBRILLATION?:[id number]} No BP recorded.  {Refresh Note OR Click here to enter BP  :1}***       Physical Exam:   VS:  There were no vitals taken for this visit.   Wt Readings from Last 3 Encounters:  08/30/23 279 lb (126.6 kg)  06/29/23 269 lb (122 kg)  06/26/23 271 lb 3.2 oz (123 kg)    General:  NAD. Neck: No JVD or HJR. Lungs: Clear to auscultation bilaterally without wheezes or crackles. Heart: Regular rate and rhythm without murmurs, rubs, or gallops. Abdomen: Soft, nontender, nondistended. Extremities: No lower extremity edema.  ASSESSMENT AND PLAN:  .    ***    {Are you ordering a CV Procedure (e.g. stress test, cath, DCCV, TEE, etc)?   Press F2        :[id number]}  Dispo: ***  Signed, Yvonne Kendall, MD

## 2024-03-29 ENCOUNTER — Ambulatory Visit: Attending: Cardiology | Admitting: Cardiology

## 2024-03-29 ENCOUNTER — Encounter: Payer: Self-pay | Admitting: Cardiology

## 2024-03-29 VITALS — BP 134/78 | HR 88 | Ht 69.0 in | Wt 291.4 lb

## 2024-03-29 DIAGNOSIS — N182 Chronic kidney disease, stage 2 (mild): Secondary | ICD-10-CM

## 2024-03-29 DIAGNOSIS — I2510 Atherosclerotic heart disease of native coronary artery without angina pectoris: Secondary | ICD-10-CM | POA: Diagnosis not present

## 2024-03-29 DIAGNOSIS — Z6841 Body Mass Index (BMI) 40.0 and over, adult: Secondary | ICD-10-CM

## 2024-03-29 DIAGNOSIS — Z720 Tobacco use: Secondary | ICD-10-CM

## 2024-03-29 DIAGNOSIS — E782 Mixed hyperlipidemia: Secondary | ICD-10-CM

## 2024-03-29 DIAGNOSIS — E66813 Obesity, class 3: Secondary | ICD-10-CM

## 2024-03-29 NOTE — Progress Notes (Signed)
 Cardiology Office Note:  .   Date:  03/29/2024  ID:  Maurice Ramos, DOB [birth year], MRN [id number] PCP: Patient, No Pcp Per  [facility] Providers Cardiologist:  Yvonne Kendall, MD    History of Present Illness: .   Maurice Ramos is a 48 y.o. male with past medical history of abdominal hernia s/p repair, GERD, kidney stones, current smoker, coronary artery disease, who presents today for follow-up of his coronary artery disease.   He presented to the emergency department at [facility] on 06/18/2021 with complaints of atypical chest pain that radiated to his bilateral arms.  He stated he had a chest pain and discomfort into his left lower arm that went up his arm across his chest and down his right arm that lasted for approximately 10 minutes.  He described the pain as a sharp pain that felt similar to the pain he had experienced in the past and attributed to nerve irritation that he had previously been treated with prednisone for.  He denied any other associated symptoms of shortness of breath, nausea vomiting or diaphoresis.  He had also recently undergone umbilical hernia repair that was uncomplicated and he was discharged home without incident.  Initial blood pressure was elevated 146/110, pulse 118, respirations of 18.  Pertinent lab blood glucose was 135, serum creatinine 1.37, calcium 11.5, WBCs 14, hemoglobin 17.9, D-dimer 0.60, troponin 2186, 3765, and 8196.  Chest x-ray revealed no acute cardiopulmonary findings and CT of the chest was negative for PE.  In the emergency department he was started on heparin infusion and given aspirin 325 mg.  He was scheduled for left heart catheterization and an echocardiogram.  Echocardiogram revealed LVEF 55 to 60%, G2 DD, without valvular abnormalities.  Left heart catheterization was completed and revealed severe single-vessel coronary artery disease with 90% stenosis in the proximal mid left circumflex, was considered the culprit for the patient's NSTEMI.  There  was also a 40% mid distal LAD disease.  He had successful CT guided PCI/DES to the proximal mid left circumflex.  Recommendation was to continue dual antiplatelet therapy with aspirin and Brilinta for minimum of 12 months and aggressive secondary prevention of coronary artery disease.  He was considered stable for discharge on 06/21/2023.    He was last seen in clinic 08/30/2023 by Dr. Okey Dupre.  At that time he was doing well.  He was using nicotine patches on smoking approximately 1 cigarette a day.  Was tolerating this medication well.  He was frustrated with not being able to lose weight despite his dietary changes.  He was continued on his current medications.  He eventually was changed from atorvastatin to rosuvastatin due to to myalgias.  He returns to clinic today, by his wife.  He states that he has done well.  Denies any chest pain or shortness of breath.  Is wanting to know when he can come off of certain medications.  Brilinta was due to stop in July.  States he has been compliant with his current medication regimen without any adverse side effects.  He has tolerated rosuvastatin since being changed from atorvastatin.  He continues to be frustrated with his weight as he has made several dietary changes without any changes noted in his weight at this time.  Denies any hospitalizations or visits to the emergency department.  ROS: 10 point review of systems has been reviewed and considered negative except ones been listed in the HPI  Studies Reviewed: Marland Kitchen   EKG Interpretation Date/Time:  Friday  March 29 2024 15:32:55 EDT Ventricular Rate:  88 PR Interval:  180 QRS Duration:  78 QT Interval:  328 QTC Calculation: 396 R Axis:   80  Text Interpretation: Normal sinus rhythm Normal ECG When compared with ECG of 26-Jun-2023 14:47, Confirmed by Charlsie Quest ([id number]) on 03/29/2024 3:35:23 PM    LHC 06/20/23 Conclusions: Severe single-vessel coronary artery disease with focal, eccentric 90% stenosis in the  proximal/mid LCx that is the culprit for the patient's NSTEMI.  There is also 40% mid/distal LAD disease. Normal left ventricular systolic function and filling pressure. Successful OCT-guided PCI to proximal/mid LCx using Onyx Frontier 2.5 x 12 mm drug-eluting stent (postdilated to 3.1 mm with 0% residual stenosis and TIMI-3 flow. Unfavorable ascending aorta/aortic arch anatomy for right radial catheterization necessitating long sheath to engage the right coronary artery.  Consider alternative access for future catheterizations.   Recommendations: Dual antiplatelet therapy with aspirin and ticagrelor for at least 12 months. Aggressive secondary prevention of coronary artery disease. Anticipate discharge home tomorrow if no postcatheterization complications ensue.  Recommend patient remain out of work until he follows up in our office in 1-2 weeks.   TTE 06/20/23  1. Left ventricular ejection fraction, by estimation, is 55 to 60%. The  left ventricle has normal function. The left ventricle demonstrates  regional wall motion abnormalities (see scoring diagram/findings for  description). Left ventricular diastolic  parameters are consistent with Grade II diastolic dysfunction  (pseudonormalization). There is mild hypokinesis of the left ventricular,  basal inferior and inferolateral segments.   2. Right ventricular systolic function is normal. The right ventricular  size is normal.   3. The mitral valve is grossly normal. No evidence of mitral valve  regurgitation. No evidence of mitral stenosis.   4. The aortic valve is tricuspid. Aortic valve regurgitation is not  visualized. No aortic stenosis is present.   5. The inferior vena cava is normal in size with greater than 50%  respiratory variability, suggesting right atrial pressure of 3 mmHg.    Risk Assessment/Calculations:             Physical Exam:   VS:  BP 134/78 (BP Location: Left Arm, Patient Position: Sitting, Cuff Size: Large)    Pulse 88   Ht 5\' 9"  (1.753 m)   Wt 291 lb 6.4 oz (132.2 kg)   SpO2 92%   BMI 43.03 kg/m    Wt Readings from Last 3 Encounters:  03/29/24 291 lb 6.4 oz (132.2 kg)  08/30/23 279 lb (126.6 kg)  06/29/23 269 lb (122 kg)    GEN: Well nourished, well developed in no acute distress NECK: No JVD; No carotid bruits CARDIAC: RRR, no murmurs, rubs, gallops RESPIRATORY:  Clear to auscultation without rales, wheezing or rhonchi  ABDOMEN: Soft, non-tender, non-distended EXTREMITIES:  No edema; No deformity   ASSESSMENT AND PLAN: .   Coronary artery disease of native coronary artery without angina with an EKG today reveals sinus rhythm with a rate of 88 with no acute changes noted.  He has had no anginal or anginal equivalent symptoms.  He has been continued on aspirin 81 mg daily, Brilinta 90 mg twice daily that should be due this time in July.  He also has sublingual nitro 0.4 mg that he has not required the use of.  He has been scheduled for labs today of CBC, CMP, and a lipid panel.  Mixed hyperlipidemia with last LDL 45 06/2023.  He has been continued on rosuvastatin 5 mg daily.  He has also been scheduled for an updated lipid panel.  Chronic kidney disease with a baseline serum creatinine 1.3-1.4.  Last time he had blood work completed was in July during his hospitalization in 2024.  He has been scheduled for repeated CMP today.  Morbid obesity with a BMI of 43.03.  He has been encouraged to continue with his dietary changes but also could increase his activity.  It was explained he and his wife that the activity he does during his regular workday is not an obvious go above and beyond what he is used to doing on a daily basis.  Tobacco abuse where he continues to smoke.  Remains on nicotine patches.  Total cessation is recommended.       Dispo: Patient return to clinic to see me/APP in 6 months or sooner if needed  Signed, Kariya Lavergne, NP

## 2024-03-29 NOTE — Patient Instructions (Signed)
 Medication Instructions:  None  *If you need a refill on your cardiac medications before your next appointment, please call your pharmacy*  Lab Work: Lipid, CMP to be done at your convenience here in our office. .   No appointment is needed. Hours are 8-5 and they are closed for lunch between 1-2.    If you have labs (blood work) drawn today and your tests are completely normal, you will receive your results only by: MyChart Message (if you have MyChart) OR A paper copy in the mail If you have any lab test that is abnormal or we need to change your treatment, we will call you to review the results.  Testing/Procedures: None  Follow-Up: At [facility], you and your health needs are our priority.  As part of our continuing mission to provide you with exceptional heart care, our providers are all part of one team.  This team includes your primary Cardiologist (physician) and Advanced Practice Providers or APPs (Physician Assistants and Nurse Practitioners) who all work together to provide you with the care you need, when you need it.  Your next appointment:   6 month(s)  Provider:   Yvonne Kendall, MD or Charlsie Quest, NP

## 2024-04-06 LAB — COMPREHENSIVE METABOLIC PANEL WITH GFR
ALT: 31 IU/L (ref 0–44)
AST: 24 IU/L (ref 0–40)
Albumin: 4.1 g/dL (ref 4.1–5.1)
Alkaline Phosphatase: 133 IU/L — ABNORMAL HIGH (ref 44–121)
BUN/Creatinine Ratio: 10 (ref 9–20)
BUN: 15 mg/dL (ref 6–24)
Bilirubin Total: 0.5 mg/dL (ref 0.0–1.2)
CO2: 22 mmol/L (ref 20–29)
Calcium: 9.6 mg/dL (ref 8.7–10.2)
Chloride: 101 mmol/L (ref 96–106)
Creatinine, Ser: 1.43 mg/dL — ABNORMAL HIGH (ref 0.76–1.27)
Globulin, Total: 2.2 g/dL (ref 1.5–4.5)
Glucose: 192 mg/dL — ABNORMAL HIGH (ref 70–99)
Potassium: 4.5 mmol/L (ref 3.5–5.2)
Sodium: 139 mmol/L (ref 134–144)
Total Protein: 6.3 g/dL (ref 6.0–8.5)
eGFR: 61 mL/min/{1.73_m2} (ref >59)

## 2024-04-06 LAB — LIPID PANEL
Chol/HDL Ratio: 4.4 ratio (ref 0.0–5.0)
Cholesterol, Total: 132 mg/dL (ref 100–199)
HDL: 30 mg/dL — ABNORMAL LOW (ref >39)
LDL Chol Calc (NIH): 61 mg/dL (ref 0–99)
Triglycerides: 253 mg/dL — ABNORMAL HIGH (ref 0–149)
VLDL Cholesterol Cal: 41 mg/dL — ABNORMAL HIGH (ref 5–40)

## 2024-04-09 ENCOUNTER — Encounter: Payer: Self-pay | Admitting: *Deleted

## 2024-04-09 NOTE — Progress Notes (Signed)
 LDL bad cholesterol remains at goal.  Kidney function is slightly elevated from last result.  Continue current medication regimen without changes at this time.  Ensure adequate hydration.  Will repeat labs on return.

## 2024-09-22 ENCOUNTER — Other Ambulatory Visit: Payer: Self-pay | Admitting: Internal Medicine

## 2024-09-23 ENCOUNTER — Other Ambulatory Visit: Payer: Self-pay | Admitting: Internal Medicine

## 2024-09-24 NOTE — Telephone Encounter (Signed)
 Please contact pt for future appointment. Pt due for 6 month f/u.

## 2024-09-25 NOTE — Telephone Encounter (Signed)
Mailbox is full unable to LVM

## 2024-10-25 ENCOUNTER — Ambulatory Visit: Attending: Cardiology | Admitting: Cardiology

## 2024-10-25 ENCOUNTER — Encounter: Payer: Self-pay | Admitting: Cardiology

## 2024-10-25 VITALS — BP 136/84 | HR 93 | Ht 69.0 in | Wt 299.0 lb

## 2024-10-25 DIAGNOSIS — I2510 Atherosclerotic heart disease of native coronary artery without angina pectoris: Secondary | ICD-10-CM | POA: Diagnosis not present

## 2024-10-25 DIAGNOSIS — Z720 Tobacco use: Secondary | ICD-10-CM

## 2024-10-25 DIAGNOSIS — N182 Chronic kidney disease, stage 2 (mild): Secondary | ICD-10-CM

## 2024-10-25 DIAGNOSIS — E782 Mixed hyperlipidemia: Secondary | ICD-10-CM | POA: Diagnosis not present

## 2024-10-25 DIAGNOSIS — Z79899 Other long term (current) drug therapy: Secondary | ICD-10-CM

## 2024-10-25 NOTE — Patient Instructions (Signed)
 Referral to Pharmd - wt loss   Medication Instructions:  Your physician recommends that you continue on your current medications as directed. Please refer to the Current Medication list given to you today.   *If you need a refill on your cardiac medications before your next appointment, please call your pharmacy*  Lab Work: Your provider would like for you to return in 5 months to have the following labs drawn: Lipid, CBC BMP.   Please go to [facility] [address] [address] [facility] [address], [place] [address] You do not need an appointment.  They are open from 8 am- 4:30 pm.  Lunch from 1:00 pm- 2:00 pm You DO need to be fasting.   If you have labs (blood work) drawn today and your tests are completely normal, you will receive your results only by: MyChart Message (if you have MyChart) OR A paper copy in the mail If you have any lab test that is abnormal or we need to change your treatment, we will call you to review the results.  Testing/Procedures: No test ordered today   Follow-Up: At [facility], you and your health needs are our priority.  As part of our continuing mission to provide you with exceptional heart care, our providers are all part of one team.  This team includes your primary Cardiologist (physician) and Advanced Practice Providers or APPs (Physician Assistants and Nurse Practitioners) who all work together to provide you with the care you need, when you need it.  Your next appointment:   6 month(s)  Provider:   You may see Lonni Hanson, MD or one of the following Advanced Practice Providers on your designated Care Team:   Tylene Lunch, NP  We recommend signing up for the patient portal called MyChart.  Sign up information is provided on this After Visit Summary.  MyChart is used to connect with patients for Virtual Visits (Telemedicine).  Patients are able to view lab/test results, encounter notes, upcoming appointments, etc.   Non-urgent messages can be sent to your provider as well.   To learn more about what you can do with MyChart, go to [email or web address].

## 2024-10-25 NOTE — Progress Notes (Signed)
 Cardiology Office Note   Date:  10/25/2024  ID:  Maurice Ramos, DOB [birth year], MRN [id number] PCP: Patient, No Pcp Per  [facility] Providers Cardiologist:  Lonni Hanson, MD Cardiology APP:  Gerard Frederick, NP     History of Present Illness Maurice Ramos is a 48 y.o. male with a past medical history of abdominal hernia s/p repair, GERD, kidney stones, current smoker, coronary artery disease, who presents today for follow-up of his coronary artery disease.   He presented to the emergency department at [facility] on 06/18/2021 with complaints of atypical chest pain that radiated to his bilateral arms.  He stated he had a chest pain and discomfort into his left lower arm that went up his arm across his chest and down his right arm that lasted for approximately 10 minutes.  He described the pain as a sharp pain that felt similar to the pain he had experienced in the past and attributed to nerve irritation that he had previously been treated with prednisone  for.  He denied any other associated symptoms of shortness of breath, nausea vomiting or diaphoresis.  He had also recently undergone umbilical hernia repair that was uncomplicated and he was discharged home without incident.  Initial blood pressure was elevated 146/110, pulse 118, respirations of 18.  Pertinent lab blood glucose was 135, serum creatinine 1.37, calcium  11.5, WBCs 14, hemoglobin 17.9, D-dimer 0.60, troponin 2186, 3765, and 8196.  Chest x-ray revealed no acute cardiopulmonary findings and CT of the chest was negative for PE.  In the emergency department he was started on heparin  infusion and given aspirin  325 mg.  He was scheduled for left heart catheterization and an echocardiogram.  Echocardiogram revealed LVEF 55 to 60%, G2 DD, without valvular abnormalities.  Left heart catheterization was completed and revealed severe single-vessel coronary artery disease with 90% stenosis in the proximal mid left circumflex, was considered the culprit  for the patient's NSTEMI.  There was also a 40% mid distal LAD disease.  He had successful CT guided PCI/DES to the proximal mid left circumflex.  Recommendation was to continue dual antiplatelet therapy with aspirin  and Brilinta  for minimum of 12 months and aggressive secondary prevention of coronary artery disease.  He was considered stable for discharge on 06/21/2023.   He was seen in clinic 08/30/2023 by Dr. Hanson.  At that time was doing well from a cardiac perspective.  He was using nicotine  patches only smoking approximately 1 cigarette a day.  There were no further testing that was ordered.  Atorvastatin  was changed to rosuvastatin  due to myalgias.  He was last seen in clinic in April 2025 stating overall he been doing well.  Denied any chest pain or shortness of breath.  Wants to know when he come off of certain medications.  States he been compliant with his current medication regimen without any undue side effects.  He was sent for labs.  There was no further testing or medication changes that were made.   He returns to clinic today stating that he has done well from a cardiac perspective.  He denies any chest pain or shortness of breath.  He states that he has been compliant with his current medication regimen without any undue changes.  He has several questions today about possibly starting GLP-1 medications for weight loss because he continues to remain active has changed his diet, has dropped sodas, and continues to gain weight.  He denies any hospitalizations or visits to the emergency department.  ROS: 10 point  review of system has been reviewed and considered negative exception was been listed in the HPI  Studies Reviewed EKG Interpretation Date/Time:  Friday October 25 2024 14:12:48 EST Ventricular Rate:  93 PR Interval:  182 QRS Duration:  82 QT Interval:  332 QTC Calculation: 412 R Axis:   78  Text Interpretation: Normal sinus rhythm Possible Inferior infarct , age undetermined  When compared with ECG of 29-Mar-2024 15:32, No significant change was found Confirmed by Gerard Frederick ([id number]) on 10/25/2024 2:17:10 PM    LHC 06/20/23 Conclusions: Severe single-vessel coronary artery disease with focal, eccentric 90% stenosis in the proximal/mid LCx that is the culprit for the patient's NSTEMI.  There is also 40% mid/distal LAD disease. Normal left ventricular systolic function and filling pressure. Successful OCT-guided PCI to proximal/mid LCx using Onyx Frontier 2.5 x 12 mm drug-eluting stent (postdilated to 3.1 mm with 0% residual stenosis and TIMI-3 flow. Unfavorable ascending aorta/aortic arch anatomy for right radial catheterization necessitating long sheath to engage the right coronary artery.  Consider alternative access for future catheterizations.   Recommendations: Dual antiplatelet therapy with aspirin  and ticagrelor  for at least 12 months. Aggressive secondary prevention of coronary artery disease. Anticipate discharge home tomorrow if no postcatheterization complications ensue.  Recommend patient remain out of work until he follows up in our office in 1-2 weeks.   TTE 06/20/23  1. Left ventricular ejection fraction, by estimation, is 55 to 60%. The  left ventricle has normal function. The left ventricle demonstrates  regional wall motion abnormalities (see scoring diagram/findings for  description). Left ventricular diastolic  parameters are consistent with Grade II diastolic dysfunction  (pseudonormalization). There is mild hypokinesis of the left ventricular,  basal inferior and inferolateral segments.   2. Right ventricular systolic function is normal. The right ventricular  size is normal.   3. The mitral valve is grossly normal. No evidence of mitral valve  regurgitation. No evidence of mitral stenosis.   4. The aortic valve is tricuspid. Aortic valve regurgitation is not  visualized. No aortic stenosis is present.   5. The inferior vena cava is normal  in size with greater than 50%  respiratory variability, suggesting right atrial pressure of 3 mmHg.   Risk Assessment/Calculations         Physical Exam VS:  BP 136/84 (BP Location: Left Arm, Patient Position: Sitting, Cuff Size: Large)   Pulse 93   Ht 5' 9 (1.753 m)   Wt 299 lb (135.6 kg)   SpO2 98%   BMI 44.15 kg/m        Wt Readings from Last 3 Encounters:  10/25/24 299 lb (135.6 kg)  03/29/24 291 lb 6.4 oz (132.2 kg)  08/30/23 279 lb (126.6 kg)    GEN: Well nourished, well developed in no acute distress NECK: No JVD; No carotid bruits CARDIAC: RRR, no murmurs, rubs, gallops RESPIRATORY:  Clear to auscultation without rales, wheezing or rhonchi  ABDOMEN: Soft, non-tender, non-distended EXTREMITIES:  No edema; No deformity   ASSESSMENT AND PLAN Coronary artery disease of native coronary arteries without angina.  EKG today reveals sinus rhythm with a rate of 93 with no ischemic changes noted.  He continues to deny angina or anginal equivalents.  He has been continued on aspirin  81 mg daily and rosuvastatin  5 mg daily.  No further ischemic evaluation needed at this time.   Hyperlipidemia with last LDL of 61 which remains a goal of less than 70 is continued on rosuvastatin  5 mg daily.  Chronic  kidney disease at baseline serum creatinine 1.3-1.4.  With his last creatinine of 1.43.  He has been scheduled for updated labs.  He has been encouraged to continue with adequate hydration.  Considered changing metoprolol  to a different blood pressure medication today but with slightly elevated serum creatinine medication changes were deferred.  He has been encouraged to avoid NSAIDs.  Morbid obesity with a BMI of 44.15.  He continues to gain weight even though he states that he has changed his diet and is remain active quit drinking sodas and is only drinking water.  Will reach out to Pharm.D. to determine if his insurance cover any of the GLP-1 medications for weight loss.  He would also  likely benefit from a sleep apnea evaluation.  Will discuss on return.  Tobacco abuse continues to smoke a reduced amount.  Total cessation continues to be recommended.       Dispo: Patient to return to clinic to see MD/APP in 6 months or sooner if needed for further evaluation.  Signed, Alixandria Friedt, NP

## 2024-10-31 ENCOUNTER — Telehealth: Payer: Self-pay | Admitting: Internal Medicine

## 2024-10-31 DIAGNOSIS — E66813 Obesity, class 3: Secondary | ICD-10-CM

## 2024-10-31 NOTE — Telephone Encounter (Signed)
 Pt c/o medication issue:  1. Name of Medication:   Weight Loss medication  2. How are you currently taking this medication (dosage and times per day)?   3. Are you having a reaction (difficulty breathing--STAT)?   4. What is your medication issue?  Patient stated he spoke with CANDIE Lunch, NP regarding weight loss medication at his last visit and is following up to get a prescription.

## 2024-11-01 NOTE — Telephone Encounter (Signed)
 Please do wegovy for CAD

## 2024-11-01 NOTE — Telephone Encounter (Signed)
 Please advise what medication needs PA.

## 2024-11-01 NOTE — Telephone Encounter (Signed)
Thanks for taking care of this.

## 2024-11-01 NOTE — Telephone Encounter (Signed)
 Called and spoke with the patient to inform him that we are waiting to find out if the weight loss medication would be covered by his insurance company and we will let him know as soon as we have that information.  Patient appreciative of the phone call and expressed gratitude.  All questions and concerns addressed at this time.

## 2024-11-04 ENCOUNTER — Other Ambulatory Visit (HOSPITAL_COMMUNITY): Payer: Self-pay

## 2024-11-04 NOTE — Telephone Encounter (Signed)
 Pharmacy Patient Advocate Encounter   Received notification from Physician's Office that prior authorization for [facility] is required/requested.   Insurance verification completed.   The patient is insured through [employer].   Per test claim: The current 28 day co-pay is, $1,537.88.  No PA needed at this time. This test claim was processed through [facility]- copay amounts may vary at other pharmacies due to pharmacy/plan contracts, or as the patient moves through the different stages of their insurance plan.

## 2024-11-20 NOTE — Telephone Encounter (Signed)
 Please have him scheduled for hemoglobin A1c per recommendation of pharmacy.

## 2024-11-20 NOTE — Telephone Encounter (Signed)
 Lab order per pharmD appointment entered - patient notified

## 2024-11-22 ENCOUNTER — Other Ambulatory Visit: Payer: Self-pay | Admitting: Emergency Medicine

## 2024-11-22 DIAGNOSIS — Z79899 Other long term (current) drug therapy: Secondary | ICD-10-CM

## 2024-11-22 DIAGNOSIS — Z6841 Body Mass Index (BMI) 40.0 and over, adult: Secondary | ICD-10-CM

## 2024-11-23 LAB — LIPID PANEL
Chol/HDL Ratio: 4.9 ratio (ref 0.0–5.0)
Cholesterol, Total: 158 mg/dL (ref 100–199)
HDL: 32 mg/dL — ABNORMAL LOW (ref >39)
LDL Chol Calc (NIH): 76 mg/dL (ref 0–99)
Triglycerides: 308 mg/dL — ABNORMAL HIGH (ref 0–149)
VLDL Cholesterol Cal: 50 mg/dL — ABNORMAL HIGH (ref 5–40)

## 2024-11-23 LAB — CBC
Hematocrit: 49.5 % (ref 37.5–51.0)
Hemoglobin: 17.2 g/dL (ref 13.0–17.7)
MCH: 33.2 pg — ABNORMAL HIGH (ref 26.6–33.0)
MCHC: 34.7 g/dL (ref 31.5–35.7)
MCV: 96 fL (ref 79–97)
Platelets: 204 x10E3/uL (ref 150–450)
RBC: 5.18 x10E6/uL (ref 4.14–5.80)
RDW: 11.3 % — ABNORMAL LOW (ref 11.6–15.4)
WBC: 9.6 x10E3/uL (ref 3.4–10.8)

## 2024-11-23 LAB — BASIC METABOLIC PANEL WITH GFR
BUN/Creatinine Ratio: 9 (ref 9–20)
BUN: 11 mg/dL (ref 6–24)
CO2: 22 mmol/L (ref 20–29)
Calcium: 9.6 mg/dL (ref 8.7–10.2)
Chloride: 97 mmol/L (ref 96–106)
Creatinine, Ser: 1.26 mg/dL (ref 0.76–1.27)
Glucose: 311 mg/dL — ABNORMAL HIGH (ref 70–99)
Potassium: 4.5 mmol/L (ref 3.5–5.2)
Sodium: 137 mmol/L (ref 134–144)
eGFR: 71 mL/min/1.73 (ref >59)

## 2024-11-23 LAB — HEMOGLOBIN A1C
Est. average glucose Bld gHb Est-mCnc: 214 mg/dL
Hgb A1c MFr Bld: 9.1 % — ABNORMAL HIGH (ref 4.8–5.6)

## 2024-11-25 ENCOUNTER — Ambulatory Visit: Payer: Self-pay | Admitting: Cardiology

## 2024-11-25 NOTE — Progress Notes (Signed)
 Kidneys are stable.  Triglycerides continue to be elevated at 308.  Hemoglobin A1c of 9.1.  Recommend urgent referral to a PCP for management of diabetes. Office in [place] was still taking new patients.

## 2024-12-24 ENCOUNTER — Ambulatory Visit: Admitting: Pharmacist

## 2024-12-28 ENCOUNTER — Other Ambulatory Visit: Payer: Self-pay | Admitting: Internal Medicine

## 2025-02-20 ENCOUNTER — Ambulatory Visit: Admitting: Pharmacist

## 2025-04-24 ENCOUNTER — Ambulatory Visit: Admitting: Cardiology
